# Patient Record
Sex: Male | Born: 1965 | Race: White | Hispanic: Refuse to answer | Marital: Married | Smoking: Former smoker
Health system: Southern US, Community
[De-identification: ages and names within clinical notes are randomized; demographics above are authoritative.]

## PROBLEM LIST (undated history)

## (undated) DIAGNOSIS — B019 Varicella without complication: Secondary | ICD-10-CM

## (undated) DIAGNOSIS — Z803 Family history of malignant neoplasm of breast: Secondary | ICD-10-CM

## (undated) DIAGNOSIS — Z800 Family history of malignant neoplasm of digestive organs: Secondary | ICD-10-CM

## (undated) DIAGNOSIS — Z8041 Family history of malignant neoplasm of ovary: Secondary | ICD-10-CM

## (undated) DIAGNOSIS — Z8481 Family history of carrier of genetic disease: Secondary | ICD-10-CM

## (undated) DIAGNOSIS — J309 Allergic rhinitis, unspecified: Secondary | ICD-10-CM

## (undated) HISTORY — DX: Family history of malignant neoplasm of breast: Z80.3

## (undated) HISTORY — DX: Family history of carrier of genetic disease: Z84.81

## (undated) HISTORY — DX: Allergic rhinitis, unspecified: J30.9

## (undated) HISTORY — DX: Family history of malignant neoplasm of digestive organs: Z80.0

## (undated) HISTORY — DX: Family history of malignant neoplasm of ovary: Z80.41

## (undated) HISTORY — DX: Varicella without complication: B01.9

---

## 2015-09-25 ENCOUNTER — Ambulatory Visit (INDEPENDENT_AMBULATORY_CARE_PROVIDER_SITE_OTHER): Payer: Managed Care, Other (non HMO) | Admitting: Family Medicine

## 2015-09-25 ENCOUNTER — Encounter: Payer: Self-pay | Admitting: Family Medicine

## 2015-09-25 VITALS — BP 124/82 | HR 87 | Temp 98.4°F | Ht 65.0 in | Wt 198.0 lb

## 2015-09-25 DIAGNOSIS — F418 Other specified anxiety disorders: Secondary | ICD-10-CM

## 2015-09-25 DIAGNOSIS — F419 Anxiety disorder, unspecified: Secondary | ICD-10-CM

## 2015-09-25 DIAGNOSIS — R6889 Other general symptoms and signs: Secondary | ICD-10-CM | POA: Diagnosis not present

## 2015-09-25 DIAGNOSIS — Z13220 Encounter for screening for lipoid disorders: Secondary | ICD-10-CM

## 2015-09-25 DIAGNOSIS — F32A Depression, unspecified: Secondary | ICD-10-CM

## 2015-09-25 DIAGNOSIS — Z0000 Encounter for general adult medical examination without abnormal findings: Secondary | ICD-10-CM

## 2015-09-25 DIAGNOSIS — E669 Obesity, unspecified: Secondary | ICD-10-CM

## 2015-09-25 DIAGNOSIS — F329 Major depressive disorder, single episode, unspecified: Secondary | ICD-10-CM | POA: Diagnosis not present

## 2015-09-25 DIAGNOSIS — Z0001 Encounter for general adult medical examination with abnormal findings: Secondary | ICD-10-CM

## 2015-09-25 NOTE — Progress Notes (Signed)
Pre visit review using our clinic review tool, if applicable. No additional management support is needed unless otherwise documented below in the visit note. 

## 2015-09-25 NOTE — Patient Instructions (Signed)
Nice to meet you. Please monitor your stress and anxiety and depression. If this worsens refill as though he needed help please let us know. We'll obtain some lab work. Please give us a call and let us know what your sisters breast cancer mutation was. Please work on diet and exercise as well.  if you develop thoughts of harming yourself or others please seek medical attention immediately.  Diet Recommendations  Starchy (carb) foods: Bread, rice, pasta, potatoes, corn, cereal, grits, crackers, bagels, muffins, all baked goods.  (Fruits, milk, and yogurt also have carbohydrate, but most of these foods will not spike your blood sugar as the starchy foods will.)  A few fruits do cause high blood sugars; use small portions of bananas (limit to 1/2 at a time), grapes, watermelon, oranges, and most tropical fruits.    Protein foods: Meat, fish, poultry, eggs, dairy foods, and beans such as pinto and kidney beans (beans also provide carbohydrate).   1. Eat at least 3 meals and 1-2 snacks per day. Never go more than 4-5 hours while awake without eating. Eat breakfast within the first hour of getting up.   2. Limit starchy foods to TWO per meal and ONE per snack. ONE portion of a starchy  food is equal to the following:   - ONE slice of bread (or its equivalent, such as half of a hamburger bun).   - 1/2 cup of a "scoopable" starchy food such as potatoes or rice.   - 15 grams of carbohydrate as shown on food label.  3. Include at every meal: a protein food, a carb food, and vegetables and/or fruit.   - Obtain twice the volume of veg's as protein or carbohydrate foods for both lunch and dinner.   - Fresh or frozen veg's are best.   - Keep frozen veg's on hand for a quick vegetable serving.

## 2015-09-26 ENCOUNTER — Encounter: Payer: Self-pay | Admitting: Family Medicine

## 2015-09-26 DIAGNOSIS — F419 Anxiety disorder, unspecified: Secondary | ICD-10-CM

## 2015-09-26 DIAGNOSIS — F329 Major depressive disorder, single episode, unspecified: Secondary | ICD-10-CM | POA: Insufficient documentation

## 2015-09-26 DIAGNOSIS — Z0001 Encounter for general adult medical examination with abnormal findings: Secondary | ICD-10-CM | POA: Insufficient documentation
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
## 2015-09-26 NOTE — Assessment & Plan Note (Signed)
Suspect related to home and work stressors. Offered options for treatment including medication and therapy, though he declined treatment at this time. Preferring to continue to monitor. He will let us know if he needs any assistance with this. Given return precautions.

## 2015-09-26 NOTE — Assessment & Plan Note (Signed)
BMI an obese range. Discussed diet and exercise.

## 2015-09-26 NOTE — Assessment & Plan Note (Signed)
Overall doing well. See anxiety and depression for discussion of this. Blood pressure in the normal range. No longer smokes. No current significant alcohol use. Tdap up-to-date. Diet and exercise discussed. We'll obtain labs as outlined.

## 2015-09-30 ENCOUNTER — Other Ambulatory Visit: Payer: Self-pay | Admitting: Family Medicine

## 2015-10-01 LAB — COMPREHENSIVE METABOLIC PANEL
A/G RATIO: 1.8 (ref 1.2–2.2)
ALK PHOS: 78 IU/L (ref 39–117)
ALT: 41 IU/L (ref 0–44)
AST: 19 IU/L (ref 0–40)
Albumin: 4.4 g/dL (ref 3.5–5.5)
BILIRUBIN TOTAL: 0.4 mg/dL (ref 0.0–1.2)
BUN/Creatinine Ratio: 13 (ref 9–20)
BUN: 12 mg/dL (ref 6–24)
CHLORIDE: 103 mmol/L (ref 96–106)
CO2: 25 mmol/L (ref 18–29)
Calcium: 10.1 mg/dL (ref 8.7–10.2)
Creatinine, Ser: 0.95 mg/dL (ref 0.76–1.27)
GFR calc non Af Amer: 94 mL/min/{1.73_m2} (ref >59)
GFR, EST AFRICAN AMERICAN: 109 mL/min/{1.73_m2} (ref >59)
Globulin, Total: 2.4 g/dL (ref 1.5–4.5)
Glucose: 100 mg/dL — ABNORMAL HIGH (ref 65–99)
POTASSIUM: 4.8 mmol/L (ref 3.5–5.2)
Sodium: 143 mmol/L (ref 134–144)
TOTAL PROTEIN: 6.8 g/dL (ref 6.0–8.5)

## 2015-10-01 LAB — CBC
HEMOGLOBIN: 16.4 g/dL (ref 12.6–17.7)
Hematocrit: 47.2 % (ref 37.5–51.0)
MCH: 31.2 pg (ref 26.6–33.0)
MCHC: 34.7 g/dL (ref 31.5–35.7)
MCV: 90 fL (ref 79–97)
Platelets: 256 10*3/uL (ref 150–379)
RBC: 5.26 x10E6/uL (ref 4.14–5.80)
RDW: 14.1 % (ref 12.3–15.4)
WBC: 7.6 10*3/uL (ref 3.4–10.8)

## 2015-10-01 LAB — LIPID PANEL W/O CHOL/HDL RATIO
CHOLESTEROL TOTAL: 212 mg/dL — AB (ref 100–199)
HDL: 30 mg/dL — ABNORMAL LOW (ref >39)
LDL CALC: 145 mg/dL — AB (ref 0–99)
TRIGLYCERIDES: 187 mg/dL — AB (ref 0–149)
VLDL Cholesterol Cal: 37 mg/dL (ref 5–40)

## 2015-10-01 LAB — HGB A1C W/O EAG: Hgb A1c MFr Bld: 5.8 % — ABNORMAL HIGH (ref 4.8–5.6)

## 2015-10-01 LAB — TSH: TSH: 1.94 u[IU]/mL (ref 0.450–4.500)

## 2015-10-10 ENCOUNTER — Ambulatory Visit (INDEPENDENT_AMBULATORY_CARE_PROVIDER_SITE_OTHER): Payer: Managed Care, Other (non HMO) | Admitting: Family Medicine

## 2015-10-10 ENCOUNTER — Encounter: Payer: Self-pay | Admitting: Family Medicine

## 2015-10-10 VITALS — BP 124/88 | HR 79 | Temp 97.6°F | Ht 65.0 in | Wt 196.8 lb

## 2015-10-10 DIAGNOSIS — J209 Acute bronchitis, unspecified: Secondary | ICD-10-CM | POA: Diagnosis not present

## 2015-10-10 DIAGNOSIS — Z8481 Family history of carrier of genetic disease: Secondary | ICD-10-CM | POA: Diagnosis not present

## 2015-10-10 DIAGNOSIS — Z803 Family history of malignant neoplasm of breast: Secondary | ICD-10-CM

## 2015-10-10 MED ORDER — PREDNISONE 20 MG PO TABS: 40.0000 mg | ORAL_TABLET | Freq: Every day | ORAL | Status: DC

## 2015-10-10 MED ORDER — ALBUTEROL SULFATE (2.5 MG/3ML) 0.083% IN NEBU
2.5000 mg | INHALATION_SOLUTION | Freq: Once | RESPIRATORY_TRACT | Status: AC
  Administered 2015-10-10: 2.5 mg via RESPIRATORY_TRACT

## 2015-10-10 MED ORDER — ALBUTEROL SULFATE HFA 108 (90 BASE) MCG/ACT IN AERS: 2.0000 | INHALATION_SPRAY | Freq: Four times a day (QID) | RESPIRATORY_TRACT | Status: DC | PRN

## 2015-10-10 MED ORDER — HYDROCODONE-HOMATROPINE 5-1.5 MG/5ML PO SYRP: 5.0000 mL | ORAL_SOLUTION | Freq: Three times a day (TID) | ORAL | Status: DC | PRN

## 2015-10-10 NOTE — Progress Notes (Signed)
Patient ID: Angel Hudson, male   DOB: 03-Nov-1965, 50 y.o.   MRN: [id number]  Tommi Rumps, MD Phone: [phone]  Angel Hudson is a 50 y.o. male who presents today for same-day visit.  Patient notes several weeks of cough and congestion. Notes most of his congestion is in his chest. Cough is productive of yellowish clear sputum. Some sinus congestion. Some ear congestion. No fevers. Some shortness of breath with cough. Mild shortness of breath with exertion. No chest pain. No diaphoresis. He has been using Delsym and Mucinex with little benefit.  Patient also found out breast cancer screening tests were done for his sister. BRCA1 appears to have been deleterious. He does not have any information on BRCA2.  PMH: Former smoker   ROS see history of present illness  Objective  Physical Exam Filed Vitals:   10/10/15 1306  BP: 124/88  Pulse: 79  Temp: 97.6 F (36.4 C)    BP Readings from Last 3 Encounters:  10/10/15 124/88  09/25/15 124/82   Wt Readings from Last 3 Encounters:  10/10/15 196 lb 12.8 oz (89.268 kg)  09/25/15 198 lb (89.812 kg)    Physical Exam  Constitutional: He is well-developed, well-nourished, and in no distress.  HENT:  Head: Normocephalic and atraumatic.  Right Ear: External ear normal.  Left Ear: External ear normal.  Mouth/Throat: Oropharynx is clear and moist. No oropharyngeal exudate.  Normal TMs bilaterally  Eyes: Conjunctivae are normal. Pupils are equal, round, and reactive to light.  Neck: Neck supple.  Cardiovascular: Normal rate, regular rhythm and normal heart sounds.   Pulmonary/Chest: Effort normal and breath sounds normal. No respiratory distress. He has no wheezes. He has no rales.  Lymphadenopathy:    He has no cervical adenopathy.  Neurological: He is alert. Gait normal.  Skin: Skin is warm and dry. He is not diaphoretic.   Patient was given an albuterol nebulizer. He reports his symptoms are similar and it is mostly chest  congestion following this. Lungs remain clear.  Assessment/Plan: Please see individual problem list.  Acute bronchitis Patient's symptoms consistent with acute bronchitis. Benign lung exam. Stable vital signs. We will treat with prednisone and an albuterol inhaler. Given return precautions.  Family history of BRCA1 gene positive Patient would like to defer testing until June at his follow-up. Plan to test BRCA1 at that time.    No orders of the defined types were placed in this encounter.    Meds ordered this encounter  Medications  . predniSONE (DELTASONE) 20 MG tablet    Sig: Take 2 tablets (40 mg total) by mouth daily with breakfast.    Dispense:  10 tablet    Refill:  0  . albuterol (PROVENTIL HFA;VENTOLIN HFA) 108 (90 Base) MCG/ACT inhaler    Sig: Inhale 2 puffs into the lungs every 6 (six) hours as needed for wheezing or shortness of breath.    Dispense:  1 Inhaler    Refill:  0  . HYDROcodone-homatropine (HYCODAN) 5-1.5 MG/5ML syrup    Sig: Take 5 mLs by mouth every 8 (eight) hours as needed for cough.    Dispense:  120 mL    Refill:  0    Tommi Rumps, MD [place]

## 2015-10-10 NOTE — Progress Notes (Signed)
Pre visit review using our clinic review tool, if applicable. No additional management support is needed unless otherwise documented below in the visit note. 

## 2015-10-10 NOTE — Assessment & Plan Note (Signed)
Patient's symptoms consistent with acute bronchitis. Benign lung exam. Stable vital signs. We will treat with prednisone and an albuterol inhaler. Given return precautions.

## 2015-10-10 NOTE — Addendum Note (Signed)
Addended by: Dorian PodWHEELEY, Homer Pfeifer J on: 10/10/2015 01:40 PM   Modules accepted: Orders

## 2015-10-10 NOTE — Assessment & Plan Note (Signed)
Patient would like to defer testing until June at his follow-up. Plan to test BRCA1 at that time.

## 2015-10-10 NOTE — Patient Instructions (Addendum)
Nice to see you.  Your symptoms are likely related to bronchitis. We will treat this with prednisone and an albuterol inhaler. You can take Hycodan for cough. If you develop chest pain, shortness of breath, cough blood, fevers, or any new or changing symptoms please seek medical attention.  Please check with your sister to see if she had BRCA2 testing completed.

## 2015-12-26 ENCOUNTER — Encounter: Payer: Self-pay | Admitting: Family Medicine

## 2015-12-26 ENCOUNTER — Ambulatory Visit (INDEPENDENT_AMBULATORY_CARE_PROVIDER_SITE_OTHER): Payer: Managed Care, Other (non HMO) | Admitting: Family Medicine

## 2015-12-26 VITALS — BP 116/80 | HR 94 | Temp 98.7°F | Wt 204.8 lb

## 2015-12-26 DIAGNOSIS — F419 Anxiety disorder, unspecified: Secondary | ICD-10-CM

## 2015-12-26 DIAGNOSIS — E785 Hyperlipidemia, unspecified: Secondary | ICD-10-CM | POA: Diagnosis not present

## 2015-12-26 DIAGNOSIS — K219 Gastro-esophageal reflux disease without esophagitis: Secondary | ICD-10-CM | POA: Diagnosis not present

## 2015-12-26 DIAGNOSIS — F418 Other specified anxiety disorders: Secondary | ICD-10-CM | POA: Diagnosis not present

## 2015-12-26 DIAGNOSIS — F329 Major depressive disorder, single episode, unspecified: Secondary | ICD-10-CM

## 2015-12-26 DIAGNOSIS — Z8481 Family history of carrier of genetic disease: Secondary | ICD-10-CM

## 2015-12-26 DIAGNOSIS — F32A Depression, unspecified: Secondary | ICD-10-CM

## 2015-12-26 DIAGNOSIS — E669 Obesity, unspecified: Secondary | ICD-10-CM

## 2015-12-26 NOTE — Assessment & Plan Note (Signed)
We will order BRCA1 testing today. This will be done through [facility]. 

## 2015-12-26 NOTE — Assessment & Plan Note (Signed)
Advised on diet and exercise. 

## 2015-12-26 NOTE — Assessment & Plan Note (Signed)
LDL elevated previously. We will recheck this today.

## 2015-12-26 NOTE — Progress Notes (Signed)
Patient ID: Angel Hudson, male   DOB: 11/25/1965, 50 y.o.   MRN: [id number]  Angel Rumps, MD Phone: [phone]  Angel Hudson is a 50 y.o. male who presents today for follow-up.  Anxiety/ depression: Patient notes he is fine from this standpoint. Minimal anxiety and depression. No SI.  HYPERLIPIDEMIA Symptoms Chest pain on exertion:  No   Leg claudication:   No Patient was to work on diet and exercise and reports he has not made any changes to this.  GERD: Patient notes intermittent feeling a burning sensation in his chest and sour taste in his throat. No chest pressure with it. No radiation. Infrequently gets a tingling underneath his tongue that is not always associated with the heartburn. Notes his symptoms are better with being up and moving around. Does not take anything for reflux at this time. No abdominal pain or blood in the stool.  Low back pain: Patient notes issues with his low back for a long time. Was under [employer] for a while. Occasionally gets pain radiating to his legs. No numbness or weakness. No loss of bowel or bladder function, saddle anesthesia, fevers, or history of cancer.  Skin tags: Patient notes several skin tags along his shirt line. Once to get these removed.  PMH: Former smoker   ROS see history of present illness  Objective  Physical Exam Filed Vitals:   12/26/15 1549  BP: 116/80  Pulse: 94  Temp: 98.7 F (37.1 C)    BP Readings from Last 3 Encounters:  12/26/15 116/80  10/10/15 124/88  09/25/15 124/82   Wt Readings from Last 3 Encounters:  12/26/15 204 lb 12.8 oz (92.897 kg)  10/10/15 196 lb 12.8 oz (89.268 kg)  09/25/15 198 lb (89.812 kg)    Physical Exam  Constitutional: He is well-developed, well-nourished, and in no distress.  HENT:  Head: Normocephalic and atraumatic.  Right Ear: External ear normal.  Left Ear: External ear normal.  Mouth/Throat: Oropharynx is clear and moist. No oropharyngeal exudate.  No  palpable lesions underneath his tongue inside of his mouth or on palpating underneath the mandible, no visual changes in this area either  Eyes: Conjunctivae are normal. Pupils are equal, round, and reactive to light.  Neck:  Several skin tags noted along his shirt line  Cardiovascular: Normal rate, regular rhythm and normal heart sounds.   Pulmonary/Chest: Effort normal and breath sounds normal.  Abdominal: Soft. Bowel sounds are normal. He exhibits no distension. There is no tenderness. There is no rebound and no guarding.  Neurological: He is alert.  CN 2-12 intact, 5/5 strength in bilateral biceps, triceps, grip, quads, hamstrings, plantar and dorsiflexion, sensation to light touch intact in bilateral UE and LE, normal gait, 2+ patellar reflexes  Skin: Skin is warm and dry. He is not diaphoretic.  Psychiatric: Mood and affect normal.     Assessment/Plan: Please see individual problem list.  Family history of BRCA1 gene positive We will order BRCA1 testing today. This will be done through [facility]  Anxiety and depression Stable. He will continue to monitor.  Hyperlipidemia LDL elevated previously. We will recheck this today.  GERD (gastroesophageal reflux disease) Patient's symptoms most consistent with GERD. Improved with physical activity and atypical nature make cardiac cause unlikely. Discussed food triggers. No obvious cause for the tingling underneath his tongue on exam. Discussed referral to ENT for this. He refused this. Prilosec for GERD. Dietary changes as well. Given return precautions.  Obesity Advised on diet and exercise.  Angel Rumps, MD [place]

## 2015-12-26 NOTE — Progress Notes (Signed)
Pre visit review using our clinic review tool, if applicable. No additional management support is needed unless otherwise documented below in the visit note. 

## 2015-12-26 NOTE — Assessment & Plan Note (Signed)
Stable.  He will continue to monitor. 

## 2015-12-26 NOTE — Patient Instructions (Signed)
Nice to see you. Please start over-the-counter Prilosec for your heartburn. Please start work on diet and exercise as well. Please go get the lab tests as ordered. If you develop chest pressure, shortness of breath, numbness, weakness, anxiety, depression, or any new or changing symptoms please seek medical attention.

## 2015-12-26 NOTE — Assessment & Plan Note (Signed)
Patient's symptoms most consistent with GERD. Improved with physical activity and atypical nature make cardiac cause unlikely. Discussed food triggers. No obvious cause for the tingling underneath his tongue on exam. Discussed referral to ENT for this. He refused this. Prilosec for GERD. Dietary changes as well. Given return precautions.

## 2016-01-17 ENCOUNTER — Other Ambulatory Visit: Payer: Self-pay | Admitting: Family Medicine

## 2016-01-20 ENCOUNTER — Telehealth: Payer: Self-pay | Admitting: Family Medicine

## 2016-01-20 NOTE — Telephone Encounter (Signed)
Angel Hudson [phone] called from [facility] regarding needing to clarify test results from 07/21 that was ordered on pt. They have not ran test yet need some addl information. Need a call back within the next 2 days if not they will have to cancel testing. Thank you!

## 2016-01-20 NOTE — Telephone Encounter (Signed)
Left a VM to return my call. 

## 2016-01-21 LAB — BRCA1 TARGETED ANALYSIS

## 2016-01-21 NOTE — Telephone Encounter (Signed)
Spoke with Stanton Kidney at [employer].  She has a specimen in the lab for his Targeted BRCA 1 testing.  They need a mutation report from a family member that has it to do a targeted analysis of the BRCA 1.  If that is not available that specific test can't be completed.  They will need to do a comprehensive BRCA 1 test (Which is more expensive) but without the mutation report is the only possibility.    If we have the requested report it can be faxed directly to [facility] at [phone].  Please advise. thanks

## 2016-01-22 NOTE — Telephone Encounter (Signed)
Please contact the patient to see if he has the targeted analysis from his sister. If he does not they will need to do the comprehensive BRCA1 test.

## 2016-01-22 NOTE — Telephone Encounter (Signed)
Left a VM to return my call. 

## 2016-01-23 ENCOUNTER — Telehealth: Payer: Self-pay | Admitting: Family Medicine

## 2016-01-23 NOTE — Telephone Encounter (Signed)
Left second message to return a call to the office, thanks

## 2016-01-23 NOTE — Telephone Encounter (Signed)
Duplicate message, thanks see other note

## 2016-01-23 NOTE — Telephone Encounter (Signed)
Pt is calling about his test results. Pt would like to speak to [facility]. [phone].

## 2016-01-23 NOTE — Telephone Encounter (Signed)
Spoke with the patient,  He would like to do just the comprehensive one. thanks

## 2016-01-23 NOTE — Telephone Encounter (Signed)
Left a message for [facility] at [employer], thanks

## 2016-01-24 NOTE — Telephone Encounter (Signed)
Spoke with mary, order changed to comprehensive as he doesn't have the analysis from his sister. They faxed a ordered to sign for provider, given to CMA to fax back, thanks

## 2016-01-29 ENCOUNTER — Telehealth: Payer: Self-pay | Admitting: *Deleted

## 2016-01-29 NOTE — Telephone Encounter (Signed)
Patient has request lab results  Pt contact [phone]

## 2016-01-29 NOTE — Telephone Encounter (Signed)
Angel Hudson called from [facility] wanting to know if a [employer] form was received it was faxed on 07/31? Form name is [employer]. Dr Birdie Sons needs to sign. Form was faxed again today. Fax back to [phone]. Attention to [facility]. Thank you!

## 2016-01-29 NOTE — Telephone Encounter (Signed)
Did this come in his mailbox today? thanks

## 2016-01-29 NOTE — Telephone Encounter (Signed)
Spoke with patient, reviewed results and documented in result note.

## 2016-01-30 NOTE — Telephone Encounter (Signed)
Formed faxed to [employer]

## 2016-02-17 ENCOUNTER — Telehealth: Payer: Self-pay | Admitting: Surgical

## 2016-02-17 NOTE — Telephone Encounter (Signed)
Luisa HartPatrick from [facility] called and the patient has not contacted them back [facility]. Today is the tenth day so they have canceled the order.

## 2016-02-24 NOTE — Telephone Encounter (Signed)
Noted. We will plan to reorder at some point in the future when the patient is ready for the test.

## 2016-02-24 NOTE — Telephone Encounter (Signed)
FYI

## 2016-03-09 ENCOUNTER — Encounter: Payer: Self-pay | Admitting: Family Medicine

## 2016-03-09 ENCOUNTER — Ambulatory Visit (INDEPENDENT_AMBULATORY_CARE_PROVIDER_SITE_OTHER): Payer: Managed Care, Other (non HMO) | Admitting: Family Medicine

## 2016-03-09 VITALS — BP 114/76 | HR 86 | Temp 98.4°F | Wt 207.8 lb

## 2016-03-09 DIAGNOSIS — Z23 Encounter for immunization: Secondary | ICD-10-CM | POA: Diagnosis not present

## 2016-03-09 DIAGNOSIS — L918 Other hypertrophic disorders of the skin: Secondary | ICD-10-CM | POA: Diagnosis not present

## 2016-03-09 DIAGNOSIS — L570 Actinic keratosis: Secondary | ICD-10-CM | POA: Insufficient documentation

## 2016-03-09 DIAGNOSIS — Q828 Other specified congenital malformations of skin: Secondary | ICD-10-CM

## 2016-03-09 NOTE — Patient Instructions (Signed)
Nice to see you. We'll call when her biopsy results are back. We will refer you to dermatology for the areas on your head. If you develop redness, warmth, drainage, bleeding, fevers, or any new symptoms in the areas of the biopsy please seek medical attention. He can use Tylenol as needed for pain with this.

## 2016-03-09 NOTE — Progress Notes (Signed)
Pre visit review using our clinic review tool, if applicable. No additional management support is needed unless otherwise documented below in the visit note. 

## 2016-03-09 NOTE — Progress Notes (Signed)
Marikay Alar, MD Phone: [phone]  Angel Hudson is a 50 y.o. male who presents today for removal of skin tags and evaluation of other skin lesions.  Patient with scattered skin tags around his neck and under his arms bilaterally skin tags around his neck can become irritated occasionally. Notes some of the ones under his arms become irritated. He wants these removed.  Patient additionally notes 2 areas of concern on his temples bilaterally that are rough and irritated and have been present for a number of months.  ROS see history of present illness  Objective  Physical Exam Vitals:   03/09/16 1552  BP: 114/76  Pulse: 86  Temp: 98.4 F (36.9 C)    BP Readings from Last 3 Encounters:  03/09/16 114/76  12/26/15 116/80  10/10/15 124/88   Wt Readings from Last 3 Encounters:  03/09/16 207 lb 12.8 oz (94.3 kg)  12/26/15 204 lb 12.8 oz (92.9 kg)  10/10/15 196 lb 12.8 oz (89.3 kg)    Physical Exam  Constitutional: No distress.  HENT:  Head:    Neck:    Pulmonary/Chest: Effort normal.  Neurological: He is alert. Gait normal.  Skin: He is not diaphoretic.     Assessment/Plan: Please see individual problem list.  Accessory skin tags Patient with numerous apparent skin tags under his arms and also around his neck. There are 4 fairly large ones around his neck of which these were removed in the areas outlined in the physical exam. See procedure below. Patient has no allergies. Consent was signed as well. We will send these for pathology to confirm that they are skin tags. Referral to dermatology to consider removal of other skin tags underneath his arms.  Actinic keratosis of left temple Area is concerning for actinic keratosis on left temple. Refer to dermatology for further evaluation and consideration of cryotherapy. Area on right temple appears to be a benign nevus though we will have dermatology review this as well.   Orders Placed This Encounter  Procedures   . Flu Vaccine QUAD 36+ mos IM  . Ambulatory referral to Dermatology    Referral Priority:   Routine    Referral Type:   Consultation    Referral Reason:   Specialty Services Required    Requested Specialty:   Dermatology    Number of Visits Requested:   1    No orders of the defined types were placed in this encounter.   Shave Biopsy Procedure Note  Pre-operative Diagnosis: skin tag  Post-operative Diagnosis: same  Locations: Location #1 posterior lateral right neck, location #2 mid lateral right neck, location #3 anterior lateral right neck, location #4 left mid lateral, areas are outlined in the exam above  Indications: symptom relief and diagnostic  Anesthesia: Lidocaine 1% with epinephrine without added sodium bicarbonate  Procedure Details  History of allergy to iodine: no  Patient informed of the risks (including bleeding and infection) and benefits of the  procedure and Written informed consent obtained.  The lesion and surrounding area were given a sterile prep using betadyne. A dermablade was used to shave the 4 locations of skin approximately 1mm by 1mm. Hemostasis achieved with pressure. Antibiotic ointment and a bandage applied.  4 specimens were sent for pathologic examination. The patient tolerated the procedure well.  EBL: Less than 1 mL  Condition: Stable  Complications: none.  Plan: 1. Instructed to keep the wound dry and covered for 24-48h and clean thereafter. 2. Warning signs of infection were reviewed.  3. Recommended that the patient use OTC acetaminophen as needed for pain.    Marikay AlarEric Shakeia Krus, MD [facility] [place]

## 2016-03-09 NOTE — Assessment & Plan Note (Signed)
Patient with numerous apparent skin tags under his arms and also around his neck. There are 4 fairly large ones around his neck of which these were removed in the areas outlined in the physical exam. See procedure below. Patient has no allergies. Consent was signed as well. We will send these for pathology to confirm that they are skin tags. Referral to dermatology to consider removal of other skin tags underneath his arms.

## 2016-03-09 NOTE — Assessment & Plan Note (Signed)
Area is concerning for actinic keratosis on left temple. Refer to dermatology for further evaluation and consideration of cryotherapy. Area on right temple appears to be a benign nevus though we will have dermatology review this as well.

## 2016-03-11 LAB — PATHOLOGY

## 2016-04-17 ENCOUNTER — Telehealth: Payer: Self-pay | Admitting: Family Medicine

## 2016-04-17 NOTE — Telephone Encounter (Signed)
Pt dropped off a  Wellness screening form to be completed by Dr. Birdie SonsSonnenberg. Paper is up front in color folder.

## 2016-04-17 NOTE — Telephone Encounter (Signed)
Form in red folder

## 2016-04-27 NOTE — Telephone Encounter (Signed)
Completed and given to Jamie. 

## 2016-04-28 NOTE — Telephone Encounter (Signed)
LM on patients phone that wellness forms will be up front for him to pick up because they need his signature.

## 2016-05-07 LAB — LDL CHOLESTEROL, DIRECT: LDL Direct: 146 mg/dL — ABNORMAL HIGH (ref 0–99)

## 2016-05-07 LAB — BRCASSURE COMPREHENSIVE TEST

## 2016-05-13 ENCOUNTER — Telehealth: Payer: Self-pay | Admitting: *Deleted

## 2016-05-13 NOTE — Telephone Encounter (Signed)
Pt stated that he received a call, he wasn't sure the reason  Pt contact [phone]

## 2016-07-09 ENCOUNTER — Ambulatory Visit (INDEPENDENT_AMBULATORY_CARE_PROVIDER_SITE_OTHER): Payer: Managed Care, Other (non HMO) | Admitting: Family Medicine

## 2016-07-09 ENCOUNTER — Encounter: Payer: Self-pay | Admitting: Family Medicine

## 2016-07-09 DIAGNOSIS — J209 Acute bronchitis, unspecified: Secondary | ICD-10-CM | POA: Diagnosis not present

## 2016-07-09 MED ORDER — AZITHROMYCIN 250 MG PO TABS: ORAL_TABLET | ORAL | 0 refills | Status: DC

## 2016-07-09 MED ORDER — PREDNISONE 50 MG PO TABS: ORAL_TABLET | ORAL | 0 refills | Status: DC

## 2016-07-09 MED ORDER — HYDROCOD POLST-CPM POLST ER 10-8 MG/5ML PO SUER: 5.0000 mL | Freq: Two times a day (BID) | ORAL | 0 refills | Status: DC | PRN

## 2016-07-09 NOTE — Assessment & Plan Note (Addendum)
New acute illness. Given prior tobacco abuse and duration of symptoms, treating with azithromycin in addition to prednisone and Tussionex.

## 2016-07-09 NOTE — Progress Notes (Signed)
Subjective:  Patient ID: Angel Hudson, male    DOB: 1966/04/27  Age: 51 y.o. MRN: [id number]  CC: Cough, congestion  HPI:  51 year old male with a prior history of tobacco abuse 20 years presents with the above complaints.  Patient states that he's been sick for 3 weeks. He's had cough, congestion. Symptoms are severe. Cough is mildly productive. He reports associated shortness of breath. No fever. He's been using Mucinex without improvement. No known exacerbating factors. No other associated symptoms. No other complaints or concerns at this time.  Social Hx   Social History   Social History  . Marital status: Married    Spouse name: N/A  . Number of children: N/A  . Years of education: N/A   Social History Main Topics  . Smoking status: Former Games developermoker  . Smokeless tobacco: None  . Alcohol use 0.0 oz/week     Comment: Rarely  . Drug use: No  . Sexual activity: Not Asked   Other Topics Concern  . None   Social History Narrative  . None   Review of Systems  Constitutional: Negative for fever.  HENT: Positive for congestion.   Respiratory: Positive for cough and shortness of breath.    Objective:  BP 129/85   Pulse 96   Temp 98.7 F (37.1 C) (Oral)   Resp 14   Wt 206 lb (93.4 kg)   SpO2 97%   BMI 34.28 kg/m   BP/Weight 07/09/2016 03/09/2016 12/26/2015  Systolic BP 129 114 116  Diastolic BP 85 76 80  Wt. (Lbs) 206 207.8 204.8  BMI 34.28 34.58 34.08   Physical Exam  Constitutional: He is oriented to person, place, and time. He appears well-developed. No distress.  HENT:  Mouth/Throat: Oropharynx is clear and moist.  Normal TMs bilaterally.  Cardiovascular: Normal rate and regular rhythm.   Pulmonary/Chest:  Diffuse wheezing.  Neurological: He is alert and oriented to person, place, and time.  Psychiatric: He has a normal mood and affect.  Vitals reviewed.  Lab Results  Component Value Date   WBC 7.6 09/30/2015   HCT 47.2 09/30/2015   PLT 256  09/30/2015   GLUCOSE 100 (H) 09/30/2015   CHOL 212 (H) 09/30/2015   TRIG 187 (H) 09/30/2015   HDL 30 (L) 09/30/2015   LDLDIRECT 146 (H) 01/17/2016   LDLCALC 145 (H) 09/30/2015   ALT 41 09/30/2015   AST 19 09/30/2015   NA 143 09/30/2015   K 4.8 09/30/2015   CL 103 09/30/2015   CREATININE 0.95 09/30/2015   BUN 12 09/30/2015   CO2 25 09/30/2015   TSH 1.940 09/30/2015   HGBA1C 5.8 (H) 09/30/2015   Assessment & Plan:   Problem List Items Addressed This Visit    Acute bronchitis    New acute illness. Given prior tobacco abuse and duration of symptoms, treating with azithromycin in addition to prednisone and Tussionex.        Meds ordered this encounter  Medications  . predniSONE (DELTASONE) 50 MG tablet    Sig: 1 tablet daily x 5 days.    Dispense:  5 tablet    Refill:  0  . chlorpheniramine-HYDROcodone (TUSSIONEX PENNKINETIC ER) 10-8 MG/5ML SUER    Sig: Take 5 mLs by mouth every 12 (twelve) hours as needed.    Dispense:  115 mL    Refill:  0  . azithromycin (ZITHROMAX) 250 MG tablet    Sig: 2 tablets on Day 1, then 1 tablet daily on Days 2-5.  Dispense:  6 tablet    Refill:  0    Follow-up: PRN  Everlene Other DO [facility]

## 2016-07-09 NOTE — Progress Notes (Signed)
Pre visit review using our clinic review tool, if applicable. No additional management support is needed unless otherwise documented below in the visit note. 

## 2016-07-09 NOTE — Patient Instructions (Signed)
Meds as prescribed.  Follow up as needed.  Take care  Dr. Adriana Simasook    Acute Bronchitis, Adult Acute bronchitis is sudden (acute) swelling of the air tubes (bronchi) in the lungs. Acute bronchitis causes these tubes to fill with mucus, which can make it hard to breathe. It can also cause coughing or wheezing. In adults, acute bronchitis usually goes away within 2 weeks. A cough caused by bronchitis may last up to 3 weeks. Smoking, allergies, and asthma can make the condition worse. Repeated episodes of bronchitis may cause further lung problems, such as chronic obstructive pulmonary disease (COPD). What are the causes? This condition can be caused by germs and by substances that irritate the lungs, including:  Cold and flu viruses. This condition is most often caused by the same virus that causes a cold.  Bacteria.  Exposure to tobacco smoke, dust, fumes, and air pollution. What increases the risk? This condition is more likely to develop in people who:  Have close contact with someone with acute bronchitis.  Are exposed to lung irritants, such as tobacco smoke, dust, fumes, and vapors.  Have a weak immune system.  Have a respiratory condition such as asthma. What are the signs or symptoms? Symptoms of this condition include:  A cough.  Coughing up clear, yellow, or green mucus.  Wheezing.  Chest congestion.  Shortness of breath.  A fever.  Body aches.  Chills.  A sore throat. How is this diagnosed? This condition is usually diagnosed with a physical exam. During the exam, your health care provider may order tests, such as chest X-rays, to rule out other conditions. He or she may also:  Test a sample of your mucus for bacterial infection.  Check the level of oxygen in your blood. This is done to check for pneumonia.  Do a chest X-ray or lung function testing to rule out pneumonia and other conditions.  Perform blood tests. Your health care provider will also ask  about your symptoms and medical history. How is this treated? Most cases of acute bronchitis clear up over time without treatment. Your health care provider may recommend:  Drinking more fluids. Drinking more makes your mucus thinner, which may make it easier to breathe.  Taking a medicine for a fever or cough.  Taking an antibiotic medicine.  Using an inhaler to help improve shortness of breath and to control a cough.  Using a cool mist vaporizer or humidifier to make it easier to breathe. Follow these instructions at home: Medicines  Take over-the-counter and prescription medicines only as told by your health care provider.  If you were prescribed an antibiotic, take it as told by your health care provider. Do not stop taking the antibiotic even if you start to feel better. General instructions  Get plenty of rest.  Drink enough fluids to keep your urine clear or pale yellow.  Avoid smoking and secondhand smoke. Exposure to cigarette smoke or irritating chemicals will make bronchitis worse. If you smoke and you need help quitting, ask your health care provider. Quitting smoking will help your lungs heal faster.  Use an inhaler, cool mist vaporizer, or humidifier as told by your health care provider.  Keep all follow-up visits as told by your health care provider. This is important. How is this prevented? To lower your risk of getting this condition again:  Wash your hands often with soap and water. If soap and water are not available, use hand sanitizer.  Avoid contact with people who  have cold symptoms.  Try not to touch your hands to your mouth, nose, or eyes.  Make sure to get the flu shot every year. Contact a health care provider if:  Your symptoms do not improve in 2 weeks of treatment. Get help right away if:  You cough up blood.  You have chest pain.  You have severe shortness of breath.  You become dehydrated.  You faint or keep feeling like you are  going to faint.  You keep vomiting.  You have a severe headache.  Your fever or chills gets worse. This information is not intended to replace advice given to you by your health care provider. Make sure you discuss any questions you have with your health care provider. Document Released: 07/23/2004 Document Revised: 01/08/2016 Document Reviewed: 12/04/2015 Elsevier Interactive Patient Education  2017 [employer].

## 2017-07-13 ENCOUNTER — Other Ambulatory Visit: Payer: Self-pay

## 2017-07-13 ENCOUNTER — Ambulatory Visit: Payer: Managed Care, Other (non HMO) | Admitting: Family Medicine

## 2017-07-13 ENCOUNTER — Encounter: Payer: Self-pay | Admitting: Family Medicine

## 2017-07-13 VITALS — BP 114/90 | HR 81 | Temp 98.1°F | Ht 67.0 in | Wt 205.0 lb

## 2017-07-13 DIAGNOSIS — E6609 Other obesity due to excess calories: Secondary | ICD-10-CM

## 2017-07-13 DIAGNOSIS — R030 Elevated blood-pressure reading, without diagnosis of hypertension: Secondary | ICD-10-CM | POA: Insufficient documentation

## 2017-07-13 DIAGNOSIS — Z6832 Body mass index (BMI) 32.0-32.9, adult: Secondary | ICD-10-CM | POA: Diagnosis not present

## 2017-07-13 DIAGNOSIS — G4733 Obstructive sleep apnea (adult) (pediatric): Secondary | ICD-10-CM | POA: Insufficient documentation

## 2017-07-13 DIAGNOSIS — E669 Obesity, unspecified: Secondary | ICD-10-CM | POA: Diagnosis not present

## 2017-07-13 DIAGNOSIS — Z23 Encounter for immunization: Secondary | ICD-10-CM | POA: Diagnosis not present

## 2017-07-13 DIAGNOSIS — R0681 Apnea, not elsewhere classified: Secondary | ICD-10-CM | POA: Diagnosis not present

## 2017-07-13 DIAGNOSIS — Z1211 Encounter for screening for malignant neoplasm of colon: Secondary | ICD-10-CM

## 2017-07-13 DIAGNOSIS — Z125 Encounter for screening for malignant neoplasm of prostate: Secondary | ICD-10-CM

## 2017-07-13 DIAGNOSIS — Z0001 Encounter for general adult medical examination with abnormal findings: Secondary | ICD-10-CM

## 2017-07-13 NOTE — Progress Notes (Signed)
Tommi Rumps, MD Phone: [phone]  Angel Hudson is a 52 y.o. male who presents today for a physical exam.  Not exercising. Does not watch what he eats.  Probably eats too many fried fatty foods and red meat. Due for colonoscopy. Due for prostate cancer screening. Declines HIV testing. Reports tetanus vaccinations up-to-date 5 years ago. Flu shot given today. No tobacco use.  Rare alcohol use.  No illicit drug use.  Patient reports his wife is concerned about apneic episodes.  He reports he does snore.  He has hypersomnia.  He does not wake up well rested.  BP is elevated.  No history of hypertension.  He does not check it at home.  Active Ambulatory Problems    Diagnosis Date Noted  . Obesity 09/25/2015  . Anxiety and depression 09/26/2015  . Encounter for general adult medical examination with abnormal findings 09/26/2015  . Family history of BRCA1 gene positive 10/10/2015  . Hyperlipidemia 12/26/2015  . GERD (gastroesophageal reflux disease) 12/26/2015  . Accessory skin tags 03/09/2016  . Actinic keratosis of left temple 03/09/2016  . Witnessed apneic spells 07/13/2017  . Elevated blood pressure reading 07/13/2017   Resolved Ambulatory Problems    Diagnosis Date Noted  . Acute bronchitis 10/10/2015   Past Medical History:  Diagnosis Date  . Allergic rhinitis   . Chickenpox     Family History  Problem Relation Age of Onset  . Alcoholism Unknown   . Heart disease Unknown   . Stroke Unknown   . Diabetes Unknown     Social History   Socioeconomic History  . Marital status: Married    Spouse name: Not on file  . Number of children: Not on file  . Years of education: Not on file  . Highest education level: Not on file  Social Needs  . Financial resource strain: Not on file  . Food insecurity - worry: Not on file  . Food insecurity - inability: Not on file  . Transportation needs - medical: Not on file  . Transportation needs - non-medical: Not on  file  Occupational History  . Not on file  Tobacco Use  . Smoking status: Former Research scientist (life sciences)  . Smokeless tobacco: Never Used  Substance and Sexual Activity  . Alcohol use: Yes    Alcohol/week: 0.0 oz    Comment: Rarely  . Drug use: No  . Sexual activity: Not on file  Other Topics Concern  . Not on file  Social History Narrative  . Not on file    ROS  General:  Negative for nexplained weight loss, fever Skin: Negative for new or changing mole, sore that won't heal HEENT: Negative for trouble hearing, trouble seeing, ringing in ears, mouth sores, hoarseness, change in voice, dysphagia. CV:  Negative for chest pain, dyspnea, edema, palpitations Resp: Negative for cough, dyspnea, hemoptysis GI: Negative for nausea, vomiting, diarrhea, constipation, abdominal pain, melena, hematochezia. GU: Negative for dysuria, incontinence, urinary hesitance, hematuria, vaginal or penile discharge, polyuria, sexual difficulty, lumps in testicle or breasts MSK: Negative for muscle cramps or aches, joint pain or swelling Neuro: Negative for headaches, weakness, numbness, dizziness, passing out/fainting Psych: Negative for depression, anxiety, memory problems  Objective  Physical Exam Vitals:   07/13/17 1549  BP: 114/90  Pulse: 81  Temp: 98.1 F (36.7 C)  SpO2: 97%    BP Readings from Last 3 Encounters:  07/13/17 114/90  07/09/16 129/85  03/09/16 114/76   Wt Readings from Last 3 Encounters:  07/13/17 205 lb (  93 kg)  07/09/16 206 lb (93.4 kg)  03/09/16 207 lb 12.8 oz (94.3 kg)    Physical Exam  Constitutional: No distress.  HENT:  Head: Normocephalic and atraumatic.  Mouth/Throat: Oropharynx is clear and moist. No oropharyngeal exudate.  Eyes: Conjunctivae are normal. Pupils are equal, round, and reactive to light.  Neck: Neck supple.  Cardiovascular: Normal rate, regular rhythm and normal heart sounds.  Pulmonary/Chest: Effort normal and breath sounds normal.  Abdominal: Soft.  Bowel sounds are normal. He exhibits no distension. There is no tenderness. There is no rebound and no guarding.  Genitourinary: Rectum normal.  Genitourinary Comments: Minimal prostate enlargement, no nodules  Musculoskeletal: He exhibits no edema.  Lymphadenopathy:    He has no cervical adenopathy.  Neurological: He is alert. Gait normal.  Skin: Skin is warm and dry. He is not diaphoretic.  Psychiatric: Mood and affect normal.     Assessment/Plan:   Encounter for general adult medical examination with abnormal findings Physical exam completed.  I discussed diet and exercise extensively with the patient.  Referral for colonoscopy placed.  PSA will be checked.  Lab work will be ordered through [facility] with CMP, A1c, and lipid panel ordered for fasting labs.  Witnessed apneic spells Patient reports his wife has witnessed apneic episodes.  We will get him set up for a home sleep study.  Obesity Discussed the need to work on diet and exercise for overall health and prevent further health complications.  Elevated blood pressure reading History of hypertension.  Patient does not have a way to check this at home.  We will have him return in 1 week for recheck.  If still elevated consider adding medication.  Encourage diet and exercise.   Orders Placed This Encounter  Procedures  . Flu Vaccine QUAD 36+ mos IM  . Ambulatory referral to Gastroenterology    Referral Priority:   Routine    Referral Type:   Consultation    Referral Reason:   Specialty Services Required    Number of Visits Requested:   1  . Home sleep test    Order Specific Question:   Where should this test be performed:    Answer:       No orders of the defined types were placed in this encounter.     , MD [facility] [place]  

## 2017-07-13 NOTE — Patient Instructions (Addendum)
Nice to see you. Please go get the lab work fasting sometime in the next week. We will get you set up for a sleep study. We will get you referred for colonoscopy as well.  Diet Recommendations  Starchy (carb) foods: Bread, rice, pasta, potatoes, corn, cereal, grits, crackers, bagels, muffins, all baked goods.  (Fruits, milk, and yogurt also have carbohydrate, but most of these foods will not spike your blood sugar as the starchy foods will.)  A few fruits do cause high blood sugars; use small portions of bananas (limit to 1/2 at a time), grapes, watermelon, oranges, and most tropical fruits.    Protein foods: Meat, fish, poultry, eggs, dairy foods, and beans such as pinto and kidney beans (beans also provide carbohydrate).   1. Eat at least 3 meals and 1-2 snacks per day. Never go more than 4-5 hours while awake without eating. Eat breakfast within the first hour of getting up.   2. Limit starchy foods to TWO per meal and ONE per snack. ONE portion of a starchy  food is equal to the following:   - ONE slice of bread (or its equivalent, such as half of a hamburger bun).   - 1/2 cup of a "scoopable" starchy food such as potatoes or rice.   - 15 grams of carbohydrate as shown on food label.  3. Include at every meal: a protein food, a carb food, and vegetables and/or fruit.   - Obtain twice the volume of veg's as protein or carbohydrate foods for both lunch and dinner.   - Fresh or frozen veg's are best.   - Keep frozen veg's on hand for a quick vegetable serving.

## 2017-07-13 NOTE — Assessment & Plan Note (Signed)
Patient reports his wife has witnessed apneic episodes.  We will get him set up for a home sleep study.

## 2017-07-13 NOTE — Assessment & Plan Note (Signed)
History of hypertension.  Patient does not have a way to check this at home.  We will have him return in 1 week for recheck.  If still elevated consider adding medication.  Encourage diet and exercise.

## 2017-07-13 NOTE — Assessment & Plan Note (Signed)
Physical exam completed.  I discussed diet and exercise extensively with the patient.  Referral for colonoscopy placed.  PSA will be checked.  Lab work will be ordered through [facility] with CMP, A1c, and lipid panel ordered for fasting labs.

## 2017-07-13 NOTE — Assessment & Plan Note (Signed)
Discussed the need to work on diet and exercise for overall health and prevent further health complications.

## 2017-07-20 ENCOUNTER — Other Ambulatory Visit: Payer: Self-pay | Admitting: Family Medicine

## 2017-07-21 LAB — COMPREHENSIVE METABOLIC PANEL
A/G RATIO: 1.7 (ref 1.2–2.2)
ALT: 31 IU/L (ref 0–44)
AST: 17 IU/L (ref 0–40)
Albumin: 4.5 g/dL (ref 3.5–5.5)
Alkaline Phosphatase: 77 IU/L (ref 39–117)
BUN/Creatinine Ratio: 12 (ref 9–20)
BUN: 11 mg/dL (ref 6–24)
Bilirubin Total: 0.4 mg/dL (ref 0.0–1.2)
CALCIUM: 9.7 mg/dL (ref 8.7–10.2)
CO2: 26 mmol/L (ref 20–29)
Chloride: 102 mmol/L (ref 96–106)
Creatinine, Ser: 0.92 mg/dL (ref 0.76–1.27)
GFR calc Af Amer: 112 mL/min/{1.73_m2} (ref >59)
GFR calc non Af Amer: 97 mL/min/{1.73_m2} (ref >59)
GLOBULIN, TOTAL: 2.7 g/dL (ref 1.5–4.5)
Glucose: 114 mg/dL — ABNORMAL HIGH (ref 65–99)
POTASSIUM: 4.1 mmol/L (ref 3.5–5.2)
SODIUM: 141 mmol/L (ref 134–144)
Total Protein: 7.2 g/dL (ref 6.0–8.5)

## 2017-07-21 LAB — LIPID PANEL W/O CHOL/HDL RATIO
Cholesterol, Total: 187 mg/dL (ref 100–199)
HDL: 32 mg/dL — ABNORMAL LOW (ref >39)
LDL Calculated: 122 mg/dL — ABNORMAL HIGH (ref 0–99)
TRIGLYCERIDES: 165 mg/dL — AB (ref 0–149)
VLDL Cholesterol Cal: 33 mg/dL (ref 5–40)

## 2017-07-21 LAB — HGB A1C W/O EAG: Hgb A1c MFr Bld: 5.7 % — ABNORMAL HIGH (ref 4.8–5.6)

## 2017-07-21 LAB — PSA: PROSTATE SPECIFIC AG, SERUM: 0.7 ng/mL (ref 0.0–4.0)

## 2017-07-21 NOTE — Progress Notes (Signed)
The 10-year ASCVD risk score Denman George(Goff DC Montez HagemanJr., et al., 2013) is: 4.1%   Values used to calculate the score:     Age: [age] years     Sex: Male     Is Non-Hispanic African American: No     Diabetic: No     Tobacco smoker: No     Systolic Blood Pressure: 114 mmHg     Is BP treated: No     HDL Cholesterol: 32 mg/dL     Total Cholesterol: 187 mg/dL

## 2017-07-28 ENCOUNTER — Telehealth: Payer: Self-pay

## 2017-07-28 NOTE — Telephone Encounter (Signed)
Sitting and reading: 2  Watching tv: 2  sitting inactive in public: 2  As a passenger in a car for an hour without a break:   Lying down to rest in the afternoon when circumstances permit: 2  Sitting and talking to someone : 0  Sitting quietly : 1  In a car in traffic: 0

## 2017-07-28 NOTE — Telephone Encounter (Signed)
Noted.  Please complete this on the insurance form and then I will sign and we can submit it.  Thanks.

## 2017-07-28 NOTE — Telephone Encounter (Signed)
Called patient to complete epworth sleepiness scale  0-no chance of dozing  1- slight chance of dozing 2-moderate chance of dozing 3- high chance of dozing    Sitting and reading?  watching tv?  Sitting inactive in public place (theater, a meeting)?  As a passenger in a car for an hour without a break?  Lying down to rest in the afternoon when circumstances permit?  Sitting and talking to someone?  Sitting quietly after lunch without alcohol?  In a car while stopped for a few minutes in traffic?  Ok for pec to speak to patient and ask questions above

## 2017-07-29 NOTE — Telephone Encounter (Signed)
faxed

## 2017-07-29 NOTE — Telephone Encounter (Signed)
It does not appear to require a signature. Please fill in our info and fax.

## 2017-07-29 NOTE — Telephone Encounter (Signed)
Placed in red folder  

## 2017-08-18 ENCOUNTER — Other Ambulatory Visit: Payer: Self-pay

## 2017-08-18 ENCOUNTER — Telehealth: Payer: Self-pay

## 2017-08-18 DIAGNOSIS — Z1211 Encounter for screening for malignant neoplasm of colon: Secondary | ICD-10-CM

## 2017-08-18 NOTE — Telephone Encounter (Signed)
Gastroenterology Pre-Procedure Review  Request Date: 09/13/17 Requesting Physician: Dr. Tobi BastosAnna  PATIENT REVIEW QUESTIONS: The patient responded to the following health history questions as indicated:    1. Are you having any GI issues? no 2. Do you have a personal history of Polyps? no 3. Do you have a family history of Colon Cancer or Polyps? no 4. Diabetes Mellitus? no 5. Joint replacements in the past 12 months?no 6. Major health problems in the past 3 months?no 7. Any artificial heart valves, MVP, or defibrillator?no    MEDICATIONS & ALLERGIES:    Patient reports the following regarding taking any anticoagulation/antiplatelet therapy:   Plavix, Coumadin, Eliquis, Xarelto, Lovenox, Pradaxa, Brilinta, or Effient? no Aspirin? no  Patient confirms/reports the following medications:  No current outpatient medications on file.   No current facility-administered medications for this visit.     Patient confirms/reports the following allergies:  No Known Allergies  No orders of the defined types were placed in this encounter.   AUTHORIZATION INFORMATION Primary Insurance: 1D#: Group #:  Secondary Insurance: 1D#: Group #:  SCHEDULE INFORMATION: Date: 09/13/17 Time: Location:[facility]

## 2017-09-07 ENCOUNTER — Telehealth: Payer: Self-pay | Admitting: Family Medicine

## 2017-09-07 DIAGNOSIS — G4733 Obstructive sleep apnea (adult) (pediatric): Secondary | ICD-10-CM

## 2017-09-07 NOTE — Telephone Encounter (Signed)
Please letthe patient know that his sleep study revealed sleep apnea.  They recommended a CPAP titration study which I would like to refer him for if he is willing.  Please check to see if he has excessive daytime sleepiness, trouble sleeping, or if he has continued to have blood pressure issues.  We will need to have him recheck his blood pressure in the office as well.  Thanks.

## 2017-09-08 NOTE — Addendum Note (Signed)
Addended by: Glori LuisSONNENBERG, Attie Nawabi G on: 09/08/2017 05:22 PM   Modules accepted: Orders

## 2017-09-08 NOTE — Telephone Encounter (Signed)
fyi

## 2017-09-08 NOTE — Telephone Encounter (Signed)
Patient returned call, patient given results per note Dr. Birdie SonsSonnenberg on 09/07/17. He says he will have to check with his insurance company to see if they will pay for the CPAP Titration Study. He also says he doesn't have daytime sleepiness or trouble sleeping. He says his BP's have been elevated and he is willing to come in for a BP check. I advised him this would be sent to Dr. Birdie SonsSonnenberg and someone will be calling him with an appointment, he verbalized understanding.

## 2017-09-08 NOTE — Telephone Encounter (Signed)
Left message to return call ok for pec to speak with patient about message below  Please letthe patient know that his sleep study revealed sleep apnea.  They recommended a CPAP titration study which I would like to refer him for if he is willing.  Please check to see if he has excessive daytime sleepiness, trouble sleeping, or if he has continued to have blood pressure issues.  We will need to have him recheck his blood pressure in the office as well.  Thanks.

## 2017-09-08 NOTE — Telephone Encounter (Signed)
With the patient's elevated blood pressure and his obstructive sleep apnea I believe this should be done.  He can check with his insurance though I went ahead and placed an order for CPAP titration so that they can process it through his insurance to see if they will pay for it.  If so he should have this done.  Thanks.

## 2017-09-09 NOTE — Telephone Encounter (Signed)
Left message to return call, ok for pec to speak to patient and notify him of message below

## 2017-09-10 NOTE — Telephone Encounter (Signed)
Left message to return call, ok for pec to speak to patient and notify him of message below 

## 2017-09-13 ENCOUNTER — Ambulatory Visit: Payer: Managed Care, Other (non HMO) | Admitting: Anesthesiology

## 2017-09-13 ENCOUNTER — Encounter
Admission: RE | Disposition: A | Discharge status: Home / Self Care | Payer: Self-pay | Source: Ambulatory Visit | Attending: Gastroenterology

## 2017-09-13 ENCOUNTER — Encounter: Payer: Self-pay | Admitting: *Deleted

## 2017-09-13 ENCOUNTER — Ambulatory Visit
Admission: RE | Admit: 2017-09-13 | Discharge: 2017-09-13 | Disposition: A | Discharge status: Home / Self Care | Payer: Managed Care, Other (non HMO) | Source: Ambulatory Visit | Attending: Gastroenterology | Admitting: Gastroenterology

## 2017-09-13 DIAGNOSIS — Z87891 Personal history of nicotine dependence: Secondary | ICD-10-CM | POA: Diagnosis not present

## 2017-09-13 DIAGNOSIS — Z1211 Encounter for screening for malignant neoplasm of colon: Secondary | ICD-10-CM | POA: Insufficient documentation

## 2017-09-13 DIAGNOSIS — K5730 Diverticulosis of large intestine without perforation or abscess without bleeding: Secondary | ICD-10-CM | POA: Diagnosis not present

## 2017-09-13 HISTORY — PX: COLONOSCOPY WITH PROPOFOL: SHX5780

## 2017-09-13 SURGERY — COLONOSCOPY WITH PROPOFOL
Anesthesia: General

## 2017-09-13 MED ORDER — FENTANYL CITRATE (PF) 100 MCG/2ML IJ SOLN
INTRAMUSCULAR | Status: AC
  Filled 2017-09-13: qty 2

## 2017-09-13 MED ORDER — MIDAZOLAM HCL 2 MG/2ML IJ SOLN
INTRAMUSCULAR | Status: DC | PRN
  Administered 2017-09-13: 2 mg via INTRAVENOUS

## 2017-09-13 MED ORDER — LIDOCAINE HCL (CARDIAC) 20 MG/ML IV SOLN
INTRAVENOUS | Status: DC | PRN
  Administered 2017-09-13: 40 mg via INTRAVENOUS

## 2017-09-13 MED ORDER — LIDOCAINE HCL (PF) 1 % IJ SOLN: 2.0000 mL | Freq: Once | INTRAMUSCULAR | Status: DC

## 2017-09-13 MED ORDER — FENTANYL CITRATE (PF) 100 MCG/2ML IJ SOLN
INTRAMUSCULAR | Status: DC | PRN
  Administered 2017-09-13: 50 ug via INTRAVENOUS

## 2017-09-13 MED ORDER — SODIUM CHLORIDE 0.9 % IV SOLN
INTRAVENOUS | Status: DC
  Administered 2017-09-13: 10:00:00 via INTRAVENOUS

## 2017-09-13 MED ORDER — LIDOCAINE HCL (PF) 1 % IJ SOLN
INTRAMUSCULAR | Status: AC
  Filled 2017-09-13: qty 2

## 2017-09-13 MED ORDER — PROPOFOL 500 MG/50ML IV EMUL
INTRAVENOUS | Status: AC
  Filled 2017-09-13: qty 50

## 2017-09-13 MED ORDER — PROPOFOL 10 MG/ML IV BOLUS
INTRAVENOUS | Status: DC | PRN
  Administered 2017-09-13: 140 mg via INTRAVENOUS
  Administered 2017-09-13: 200 mg via INTRAVENOUS

## 2017-09-13 MED ORDER — MIDAZOLAM HCL 2 MG/2ML IJ SOLN
INTRAMUSCULAR | Status: AC
Start: 2017-09-13 — End: ?
  Filled 2017-09-13: qty 2

## 2017-09-13 MED ORDER — PROPOFOL 500 MG/50ML IV EMUL
INTRAVENOUS | Status: DC | PRN
  Administered 2017-09-13: 180 ug/kg/min via INTRAVENOUS

## 2017-09-13 NOTE — Anesthesia Post-op Follow-up Note (Signed)
Anesthesia QCDR form completed.        

## 2017-09-13 NOTE — Anesthesia Preprocedure Evaluation (Signed)
Anesthesia Evaluation  Patient identified by MRN, date of birth, ID band Patient awake    Reviewed: Allergy & Precautions, NPO status , Patient's Chart, lab work & pertinent test results  History of Anesthesia Complications Negative for: history of anesthetic complications  Airway Mallampati: II  TM Distance: >3 FB Neck ROM: Full    Dental no notable dental hx.    Pulmonary neg sleep apnea, neg COPD, former smoker,    breath sounds clear to auscultation- rhonchi (-) wheezing      Cardiovascular Exercise Tolerance: Good (-) hypertension(-) CAD, (-) Past MI, (-) Cardiac Stents and (-) CABG  Rhythm:Regular Rate:Normal - Systolic murmurs and - Diastolic murmurs    Neuro/Psych Anxiety negative neurological ROS     GI/Hepatic Neg liver ROS, GERD  ,  Endo/Other  negative endocrine ROSneg diabetes  Renal/GU negative Renal ROS     Musculoskeletal negative musculoskeletal ROS (+)   Abdominal (+) + obese,   Peds  Hematology negative hematology ROS (+)   Anesthesia Other Findings Past Medical History: No date: Allergic rhinitis No date: Chickenpox   Reproductive/Obstetrics                             Anesthesia Physical Anesthesia Plan  ASA: II  Anesthesia Plan: General   Post-op Pain Management:    Induction: Intravenous  PONV Risk Score and Plan: 1 and Propofol infusion  Airway Management Planned: Natural Airway  Additional Equipment:   Intra-op Plan:   Post-operative Plan:   Informed Consent: I have reviewed the patients History and Physical, chart, labs and discussed the procedure including the risks, benefits and alternatives for the proposed anesthesia with the patient or authorized representative who has indicated his/her understanding and acceptance.   Dental advisory given  Plan Discussed with: CRNA and Anesthesiologist  Anesthesia Plan Comments:          Anesthesia Quick Evaluation

## 2017-09-13 NOTE — Op Note (Signed)
[facility] Gastroenterology Patient Name: Angel Hudson Procedure Date: 09/13/2017 10:05 AM MRN: [id number] Account #: [id number] Date of Birth: March 01, 1966 Admit Type: Outpatient Age: 52 [facility] ROOM 2 Gender: Male Note Status: Finalized Procedure:            Colonoscopy Indications:          Screening for colorectal malignant neoplasm Providers:            Wyline MoodKiran Mahek Schlesinger MD, MD Referring MD:         Yehuda MaoEric G. Birdie SonsSonnenberg (Referring MD) Medicines:            Monitored Anesthesia Care Complications:        No immediate complications. Procedure:            Pre-Anesthesia Assessment:                       - Prior to the procedure, a History and Physical was                        performed, and patient medications, allergies and                        sensitivities were reviewed. The patient's tolerance of                        previous anesthesia was reviewed.                       - The risks and benefits of the procedure and the                        sedation options and risks were discussed with the                        patient. All questions were answered and informed                        consent was obtained.                       - After reviewing the risks and benefits, the patient                        was deemed in satisfactory condition to undergo the                        procedure.                       - ASA Grade Assessment: II - A patient with mild                        systemic disease.                       After obtaining informed consent, the colonoscope was                        passed under direct vision. Throughout the procedure,                        the  patient's blood pressure, pulse, and oxygen                        saturations were monitored continuously. The                        Colonoscope was introduced through the anus and                        advanced to the the cecum, identified by the                         appendiceal orifice, IC valve and transillumination.                        The colonoscopy was performed with ease. The patient                        tolerated the procedure well. The quality of the bowel                        preparation was good. Findings:      The perianal and digital rectal examinations were normal.      Multiple small-mouthed diverticula were found in the sigmoid colon.      The entire examined colon appeared normal on direct and retroflexion       views.      The exam was otherwise without abnormality on direct and retroflexion       views. Impression:           - Diverticulosis in the sigmoid colon.                       - The entire examined colon is normal on direct and                        retroflexion views.                       - The examination was otherwise normal on direct and                        retroflexion views.                       - No specimens collected. Recommendation:       - Discharge patient to home (with escort).                       - Resume previous diet.                       - Continue present medications.                       - Repeat colonoscopy in 10 years for screening purposes. Procedure Code(s):    --- Professional ---                       [id number], Colonoscopy, flexible; diagnostic, including                        collection of specimen(s)  by brushing or washing, when                        performed (separate procedure) Diagnosis Code(s):    --- Professional ---                       Z12.11, Encounter for screening for malignant neoplasm                        of colon                       K57.30, Diverticulosis of large intestine without                        perforation or abscess without bleeding CPT copyright 2016 American Medical Association. All rights reserved. The codes documented in this report are preliminary and upon coder review may  be revised to meet current compliance requirements. Wyline Mood,  MD Wyline Mood MD, MD 09/13/2017 10:27:51 AM This report has been signed electronically. Number of Addenda: 0 Note Initiated On: 09/13/2017 10:05 AM Scope Withdrawal Time: 0 hours 8 minutes 53 seconds  Total Procedure Duration: 0 hours 10 minutes 46 seconds       [facility]

## 2017-09-13 NOTE — Transfer of Care (Signed)
Immediate Anesthesia Transfer of Care Note  Patient: Angel Hudson  Procedure(s) Performed: COLONOSCOPY WITH PROPOFOL (N/A )  Patient Location: PACU  Anesthesia Type:General  Level of Consciousness: sedated  Airway & Oxygen Therapy: Patient Spontanous Breathing and Patient connected to nasal cannula oxygen  Post-op Assessment: Report given to RN and Post -op Vital signs reviewed and stable  Post vital signs: Reviewed and stable  Last Vitals:  Vitals:   09/13/17 0923  BP: (!) 141/99  Pulse: 73  Resp: 16  Temp: (!) 35.9 C  SpO2: 100%    Last Pain:  Vitals:   09/13/17 0923  TempSrc: Tympanic         Complications: No apparent anesthesia complications

## 2017-09-13 NOTE — H&P (Addendum)
     Angel MoodKiran Becket Wecker, MD [address], [address], [place], [place], [address] [address], [address], [place], [place], [address] Phone: [phone]  Fax: [phone]  Primary Care Physician:  Glori LuisSonnenberg, Angel G, MD   Pre-Procedure History & Physical: HPI:  Angel Hudson is a 52 y.o. male is here for an colonoscopy.   Past Medical History:  Diagnosis Date  . Allergic rhinitis   . Chickenpox     No past surgical history on file.  Prior to Admission medications   Not on File    Allergies as of 08/19/2017  . (No Known Allergies)    Family History  Problem Relation Age of Onset  . Alcoholism Unknown   . Heart disease Unknown   . Stroke Unknown   . Diabetes Unknown     Social History   Socioeconomic History  . Marital status: Married    Spouse name: Not on file  . Number of children: Not on file  . Years of education: Not on file  . Highest education level: Not on file  Social Needs  . Financial resource strain: Not on file  . Food insecurity - worry: Not on file  . Food insecurity - inability: Not on file  . Transportation needs - medical: Not on file  . Transportation needs - non-medical: Not on file  Occupational History  . Not on file  Tobacco Use  . Smoking status: Former Games developermoker  . Smokeless tobacco: Never Used  Substance and Sexual Activity  . Alcohol use: Yes    Alcohol/week: 0.0 oz    Comment: Rarely  . Drug use: No  . Sexual activity: Not on file  Other Topics Concern  . Not on file  Social History Narrative  . Not on file    Review of Systems: See HPI, otherwise negative ROS  Physical Exam: BP (!) 141/99   Pulse 73   Temp (!) 96.6 F (35.9 C) (Tympanic)   Resp 16   Ht 5\' 6"  (1.676 m)   Wt 200 lb (90.7 kg)   SpO2 100%   BMI 32.28 kg/m  General:   Alert,  pleasant and cooperative in NAD Head:  Normocephalic and atraumatic. Neck:  Supple; no masses or thyromegaly. Lungs:  Clear throughout to auscultation, normal respiratory  effort.    Heart:  +S1, +S2, Regular rate and rhythm, No edema. Abdomen:  Soft, nontender and nondistended. Normal bowel sounds, without guarding, and without rebound.   Neurologic:  Alert and  oriented x4;  grossly normal neurologically.  Impression/Plan: Angel Hudson is here for an colonoscopy to be performed for Screening colonoscopy average risk   Risks, benefits, limitations, and alternatives regarding  colonoscopy have been reviewed with the patient.  Questions have been answered.  All parties agreeable.   Angel MoodKiran Dilynn Munroe, MD  09/13/2017, 10:00 AM

## 2017-09-13 NOTE — Anesthesia Procedure Notes (Signed)
Date/Time: 09/13/2017 10:20 AM Performed by: Henrietta HooverPope, Larence Thone, CRNA Pre-anesthesia Checklist: Patient identified, Emergency Drugs available, Suction available, Patient being monitored and Timeout performed Patient Re-evaluated:Patient Re-evaluated prior to induction Oxygen Delivery Method: Nasal cannula Placement Confirmation: positive ETCO2

## 2017-09-13 NOTE — Anesthesia Postprocedure Evaluation (Signed)
Anesthesia Post Note  Patient: Merrily Pewhomas Wehling  Procedure(s) Performed: COLONOSCOPY WITH PROPOFOL (N/A )  Patient location during evaluation: Endoscopy Anesthesia Type: General Level of consciousness: awake and alert and oriented Pain management: pain level controlled Vital Signs Assessment: post-procedure vital signs reviewed and stable Respiratory status: spontaneous breathing, nonlabored ventilation and respiratory function stable Cardiovascular status: blood pressure returned to baseline and stable Postop Assessment: no signs of nausea or vomiting Anesthetic complications: no     Last Vitals:  Vitals:   09/13/17 1047 09/13/17 1057  BP: (!) 113/96 (!) 132/93  Pulse: 76 75  Resp: 15 17  Temp:    SpO2: 97% 98%    Last Pain:  Vitals:   09/13/17 1037  TempSrc: Tympanic                 Amrita Radu

## 2017-09-14 ENCOUNTER — Encounter: Payer: Self-pay | Admitting: Gastroenterology

## 2017-09-14 NOTE — Telephone Encounter (Signed)
Noted  

## 2017-09-14 NOTE — Telephone Encounter (Signed)
Left message to return call, ok for pec to speak to patient and notify him of message below 

## 2017-09-14 NOTE — Telephone Encounter (Signed)
Notified pt of msg below - he has not called ins yet bc he had a colonoscopy yesterday - he will call to see what is covered and call back.

## 2017-09-14 NOTE — Telephone Encounter (Signed)
fyi

## 2017-09-28 ENCOUNTER — Ambulatory Visit: Payer: Managed Care, Other (non HMO) | Admitting: *Deleted

## 2017-09-28 VITALS — BP 122/88 | HR 74 | Resp 18

## 2017-09-28 DIAGNOSIS — R030 Elevated blood-pressure reading, without diagnosis of hypertension: Secondary | ICD-10-CM

## 2017-09-29 ENCOUNTER — Encounter: Payer: Self-pay | Admitting: *Deleted

## 2017-09-29 ENCOUNTER — Telehealth: Payer: Self-pay | Admitting: Family Medicine

## 2017-09-29 NOTE — Telephone Encounter (Signed)
Please advise, did you fax this?

## 2017-09-29 NOTE — Progress Notes (Signed)
Patient present ed for BP check to left arm BP 122/88 pulse 74 patient was due to come in One week form last OV patient stated he had not been able to come in . Patient also wanted to inform PCP that he still needed some paper work for his CPAP but could not remember exactly what was needed so patient will call advise after speaking with insurance,

## 2017-09-29 NOTE — Telephone Encounter (Signed)
Please advise 

## 2017-09-29 NOTE — Telephone Encounter (Signed)
Copied from CRM [id number]. Topic: Quick Communication - See Telephone Encounter >> Sep 29, 2017  8:49 AM Rudi CocoLathan, Stormie Ventola M, NT wrote: CRM for notification. See Telephone encounter for: 09/29/17.  Misty StanleyStacey from [facility] called and said they received the fax for the order for the sleep study but there wasn't an rx sent. Misty StanleyStacey can be reached at [phone] ext.[phone]

## 2017-09-30 ENCOUNTER — Telehealth: Payer: Self-pay | Admitting: *Deleted

## 2017-09-30 NOTE — Progress Notes (Signed)
Left message for patient to return call to office. 

## 2017-09-30 NOTE — Telephone Encounter (Signed)
Left message for patient to return call to office please advise patient of PCP orders pasted below. PEC nurse may advise.   Attestation signed by Glori LuisSonnenberg, Eric G, MD at 09/30/2017 10:16 AM  Diastolic blood pressure is above goal.  I would recommend adding a low-dose of amlodipine if he is willing.  I would start with amlodipine 5 mg once daily by mouth.  90 tablets.  3 refills.  He would need a blood pressure check in 1 month if he starts on medication.  Needs follow-up with me in 3 months.  Marikay AlarEric Sonnenberg, MD

## 2017-09-30 NOTE — Progress Notes (Signed)
See phone note

## 2017-09-30 NOTE — Telephone Encounter (Signed)
NT line is busy. Please call pt back and advise of the below. CB#: [phone]

## 2017-09-30 NOTE — Telephone Encounter (Signed)
Patient called, left VM to return call for advice from Dr. Birdie SonsSonnenberg.

## 2017-10-05 NOTE — Progress Notes (Signed)
Patient stated he would like to wait until he gets his CPAP and supplies and starts getting a good nights sleep and see if the lack of sleep is what is effecting his BP.

## 2017-10-05 NOTE — Telephone Encounter (Signed)
Angel StanleyStacey is calling back checking on status of rx being sent to them. After 2 days the order will be placed on hold.

## 2017-10-05 NOTE — Telephone Encounter (Signed)
Left message for patient to return call for information regarding recent nurse visit for BP.

## 2017-10-05 NOTE — Telephone Encounter (Signed)
Yes it has been faxed with all the information that they needed.

## 2017-10-05 NOTE — Telephone Encounter (Signed)
Please advise, I have placed a fax that came in today on your desk, has this already been faxed?

## 2017-10-05 NOTE — Telephone Encounter (Signed)
Patient stated he would like to wait until he gets his CPAP and supplies and starts getting a good nights sleep and see if the lack of sleep is what is effecting his BP. Clincal note sent to PCP,

## 2017-10-07 NOTE — Progress Notes (Signed)
Noted  

## 2017-11-01 ENCOUNTER — Telehealth: Payer: Self-pay | Admitting: Family Medicine

## 2017-11-01 NOTE — Telephone Encounter (Signed)
Order form received for CPAP titration.  He needs to have an Epworth Sleepiness Scale completed.  Please contact him to complete the Epworth Sleepiness Scale over the phone and also print out a list of his medications to attach and once this is done I can complete the form.  I have placed this on Jessica's desk.

## 2017-11-01 NOTE — Telephone Encounter (Signed)
Left message to return call, please transfer patient to me or Patina once he is on the phone to complete epworth sleepiness scale

## 2017-11-04 NOTE — Telephone Encounter (Signed)
Left message to return call, please transfer patient to me or Patina once he is on the phone to complete epworth sleepiness scale 

## 2017-11-04 NOTE — Telephone Encounter (Signed)
Placed on your desk, patient is not taking any medications at this time

## 2017-11-04 NOTE — Telephone Encounter (Signed)
Patient called office back and patient answered all questions I informed him that I would give the form back to [facility] and have Dr. Birdie Sons sign it and then fax. Patient verbalized understanding.

## 2017-11-05 NOTE — Telephone Encounter (Signed)
Completed.  I will forward to [facility] so she can fax to sleep med.

## 2017-12-22 ENCOUNTER — Ambulatory Visit (INDEPENDENT_AMBULATORY_CARE_PROVIDER_SITE_OTHER): Payer: Managed Care, Other (non HMO) | Admitting: Podiatry

## 2017-12-22 ENCOUNTER — Encounter: Payer: Self-pay | Admitting: Podiatry

## 2017-12-22 ENCOUNTER — Ambulatory Visit (INDEPENDENT_AMBULATORY_CARE_PROVIDER_SITE_OTHER): Payer: Managed Care, Other (non HMO)

## 2017-12-22 ENCOUNTER — Other Ambulatory Visit: Payer: Self-pay | Admitting: Podiatry

## 2017-12-22 DIAGNOSIS — M778 Other enthesopathies, not elsewhere classified: Secondary | ICD-10-CM

## 2017-12-22 DIAGNOSIS — M722 Plantar fascial fibromatosis: Secondary | ICD-10-CM

## 2017-12-22 DIAGNOSIS — M779 Enthesopathy, unspecified: Principal | ICD-10-CM

## 2017-12-22 MED ORDER — MELOXICAM 15 MG PO TABS: 15.0000 mg | ORAL_TABLET | Freq: Every day | ORAL | 3 refills | Status: DC

## 2017-12-22 MED ORDER — METHYLPREDNISOLONE 4 MG PO TBPK: ORAL_TABLET | ORAL | 0 refills | Status: DC

## 2017-12-22 NOTE — Patient Instructions (Signed)

## 2017-12-22 NOTE — Progress Notes (Signed)
  Subjective:  Patient ID: Angel Hudson, male    DOB: [birth year],  MRN: [id number] HPI Chief Complaint  Patient presents with  . Foot Pain    Plantar heel left - aching x few months, knee pain more on left, no AM pain, tried advil prn  . New Patient (Initial Visit)    52 y.o. male presents with the above complaint.   ROS: Denies fever chills nausea vomiting muscle aches pains calf pain back pain chest pain shortness of breath.  Relates right knee pain.  Past Medical History:  Diagnosis Date  . Allergic rhinitis   . Chickenpox    Past Surgical History:  Procedure Laterality Date  . COLONOSCOPY WITH PROPOFOL N/A 09/13/2017   Procedure: COLONOSCOPY WITH PROPOFOL;  Surgeon: Wyline MoodAnna, Kiran, MD;  [facility];  Service: Gastroenterology;  Laterality: N/A;    Current Outpatient Medications:  .  ibuprofen (ADVIL,MOTRIN) 200 MG tablet, Take by mouth., Disp: , Rfl:  .  meloxicam (MOBIC) 15 MG tablet, Take 1 tablet (15 mg total) by mouth daily., Disp: 30 tablet, Rfl: 3 .  methylPREDNISolone (MEDROL DOSEPAK) 4 MG TBPK tablet, 6 day dose pack - take as directed, Disp: 21 tablet, Rfl: 0  No Known Allergies Review of Systems Objective:  There were no vitals filed for this visit.  General: Well developed, nourished, in no acute distress, alert and oriented x3   Dermatological: Skin is warm, dry and supple bilateral. Nails x 10 are well maintained; remaining integument appears unremarkable at this time. There are no open sores, no preulcerative lesions, no rash or signs of infection present.  Vascular: Dorsalis Pedis artery and Posterior Tibial artery pedal pulses are 2/4 bilateral with immedate capillary fill time. Pedal hair growth present. No varicosities and no lower extremity edema present bilateral.   Neruologic: Grossly intact via light touch bilateral. Vibratory intact via tuning fork bilateral. Protective threshold with Semmes Wienstein monofilament intact to all pedal  sites bilateral. Patellar and Achilles deep tendon reflexes 2+ bilateral. No Babinski or clonus noted bilateral.   Musculoskeletal: No gross boney pedal deformities bilateral. No pain, crepitus, or limitation noted with foot and ankle range of motion bilateral. Muscular strength 5/5 in all groups tested bilateral.  Pain on palpation medial calcaneal tubercle of the left heel some of the right heel but not nearly as sensitive.  Gait: Unassisted, Nonantalgic.    Radiographs:  Demonstrates a soft tissue increase in density plantar fashion calcaneal insertion site small plantar distally oriented calcaneal spur is noted.  No acute findings.  Assessment & Plan:   Assessment: Plantar fasciitis left.  Long-standing.  Plan: Discussed etiology pathology conservative versus surgical therapies.  Started him on a Medrol Dosepak to be followed by meloxicam.  Placed him in a plantar fascial brace after a heel injection.  20 mg of Kenalog 5 mg Marcaine after sterile Betadine skin prep tolerated procedure well.  Discussed appropriate shoe gear stretching exercise ice therapy sugar modifications.  I will follow-up with him in 1 month     Amira Podolak T. [place], Angel Hudson

## 2018-01-24 ENCOUNTER — Encounter: Payer: Self-pay | Admitting: Podiatry

## 2018-01-24 ENCOUNTER — Ambulatory Visit (INDEPENDENT_AMBULATORY_CARE_PROVIDER_SITE_OTHER): Payer: Managed Care, Other (non HMO) | Admitting: Podiatry

## 2018-01-24 DIAGNOSIS — M722 Plantar fascial fibromatosis: Secondary | ICD-10-CM

## 2018-01-24 NOTE — Progress Notes (Signed)
He presents today for follow-up of plantar fasciitis he states that he is 90% better but has not been using his brace nor has he been taking his anti-inflammatories.  Objective: Vital signs are stable he is alert and oriented x3.  He has no pain on palpation medial calcaneal tubercle of the left heel pulses remain strong and palpable.  Assessment: Resolution of plantar fasciitis left.  Plan: Encouraged him to continue to wear the night splint and plantar pressure brace and the take the meloxicam on a regular basis ~follow-up with him on an as-needed basis with recurrence or regression and consider orthotics at that time.

## 2018-05-13 ENCOUNTER — Encounter: Payer: Self-pay | Admitting: Family Medicine

## 2018-05-13 ENCOUNTER — Ambulatory Visit (INDEPENDENT_AMBULATORY_CARE_PROVIDER_SITE_OTHER): Payer: Managed Care, Other (non HMO) | Admitting: Family Medicine

## 2018-05-13 DIAGNOSIS — E785 Hyperlipidemia, unspecified: Secondary | ICD-10-CM | POA: Diagnosis not present

## 2018-05-13 DIAGNOSIS — R7303 Prediabetes: Secondary | ICD-10-CM | POA: Diagnosis not present

## 2018-05-13 DIAGNOSIS — G4733 Obstructive sleep apnea (adult) (pediatric): Secondary | ICD-10-CM

## 2018-05-13 LAB — POCT GLYCOSYLATED HEMOGLOBIN (HGB A1C): HEMOGLOBIN A1C: 5 % (ref 4.0–5.6)

## 2018-05-13 NOTE — Assessment & Plan Note (Signed)
Patient does not want to start on statin therapy.  Encourage diet and exercise.  Discussed dietary changes.

## 2018-05-13 NOTE — Assessment & Plan Note (Signed)
Continue CPAP.  Symptoms have improved.

## 2018-05-13 NOTE — Patient Instructions (Signed)
Nice to see you. Please work on diet and exercise. Please continue your CPAP.

## 2018-05-13 NOTE — Progress Notes (Signed)
  Marikay AlarEric , MD Phone: [phone]  Angel Hudson is a 52 y.o. male who presents today for follow-up.  CC: Hyperlipidemia, prediabetes, sleep apnea  Hyperlipidemia: He is not exercising.  Eats too much fried food.  He eats too late.  He does like greens.  Is drinking 1-2 sodas daily. No CP or SOB.  Prediabetes: Due for recheck A1c.  Sleep apnea: He uses his CPAP nightly 7 to 8 hours.  He does wake up well rested.  No hypersomnia.  Social History   Tobacco Use  Smoking Status Former Smoker  Smokeless Tobacco Never Used     ROS see history of present illness  Objective  Physical Exam Vitals:   05/13/18 1406  BP: 116/82  Pulse: 92  Resp: 15  Temp: 98.3 F (36.8 C)  SpO2: 94%    BP Readings from Last 3 Encounters:  05/13/18 116/82  09/29/17 122/88  09/13/17 (!) 132/93   Wt Readings from Last 3 Encounters:  05/13/18 201 lb 12.8 oz (91.5 kg)  09/13/17 200 lb (90.7 kg)  07/13/17 205 lb (93 kg)    Physical Exam  Constitutional: No distress.  Cardiovascular: Normal rate, regular rhythm and normal heart sounds.  Pulmonary/Chest: Effort normal and breath sounds normal.  Musculoskeletal: He exhibits no edema.  Neurological: He is alert.  Skin: Skin is warm and dry. He is not diaphoretic.     Assessment/Plan: Please see individual problem list.  Hyperlipidemia Patient does not want to start on statin therapy.  Encourage diet and exercise.  Discussed dietary changes.  Prediabetes Check A1c.  Discussed dietary changes and exercise.  OSA (obstructive sleep apnea) Continue CPAP.  Symptoms have improved.   Orders Placed This Encounter  Procedures  . POCT HgB A1C    No orders of the defined types were placed in this encounter.    Marikay AlarEric , MD [facility]

## 2018-05-13 NOTE — Assessment & Plan Note (Signed)
Check A1c.  Discussed dietary changes and exercise.

## 2018-11-30 ENCOUNTER — Other Ambulatory Visit: Payer: Self-pay

## 2018-11-30 ENCOUNTER — Ambulatory Visit (INDEPENDENT_AMBULATORY_CARE_PROVIDER_SITE_OTHER): Payer: Managed Care, Other (non HMO) | Admitting: Podiatry

## 2018-11-30 ENCOUNTER — Encounter: Payer: Self-pay | Admitting: Podiatry

## 2018-11-30 VITALS — Temp 98.2°F

## 2018-11-30 DIAGNOSIS — M722 Plantar fascial fibromatosis: Secondary | ICD-10-CM

## 2018-11-30 NOTE — Progress Notes (Signed)
He presents today for follow-up of his plantar fasciitis of his left foot states that is starting to bother him again.  Objective: Vital signs are stable alert and oriented x3.  Pulses are palpable.  Pain on palpation medial calcaneal tubercle of the left heel.  Assessment: Recurrent plantar fasciitis.  Plan: Reinjected left heel today with 20 mg Kenalog 5 mg of Marcaine for maximal tenderness.  We also talked to [place] and he will go ahead and set him up with a set of orthotics.

## 2018-12-21 ENCOUNTER — Other Ambulatory Visit: Payer: Self-pay

## 2018-12-21 ENCOUNTER — Ambulatory Visit: Payer: Managed Care, Other (non HMO) | Admitting: Orthotics

## 2018-12-21 DIAGNOSIS — M722 Plantar fascial fibromatosis: Secondary | ICD-10-CM

## 2018-12-21 NOTE — Progress Notes (Signed)
Patient came in today to p/up functional foot orthotics.   The orthotics were assessed to both fit and function.  The F/O addressed the biomechanical issues/pathologies as intended, offering good longitudinal arch support, proper offloading, and foot support. There weren't any signs of discomfort or irritation.  The F/O fit properly in footwear with minimal trimming/adjustments. 

## 2018-12-29 ENCOUNTER — Other Ambulatory Visit: Payer: Self-pay | Admitting: *Deleted

## 2018-12-29 MED ORDER — MELOXICAM 15 MG PO TABS: 15.0000 mg | ORAL_TABLET | Freq: Every day | ORAL | 3 refills | Status: DC

## 2018-12-29 NOTE — Telephone Encounter (Signed)
Sent refill meloxicam to pharmacy

## 2019-01-03 ENCOUNTER — Telehealth: Payer: Self-pay | Admitting: *Deleted

## 2019-01-03 NOTE — Telephone Encounter (Signed)
Pt requested refill of the meloxicam.

## 2019-01-04 NOTE — Telephone Encounter (Signed)
Patient notified via voice mail that rx for Meloxicam was sent to [facility] [address] on 12/29/2018.

## 2019-01-18 ENCOUNTER — Ambulatory Visit: Payer: Managed Care, Other (non HMO) | Admitting: Podiatry

## 2021-04-13 ENCOUNTER — Other Ambulatory Visit: Payer: Self-pay

## 2021-04-13 DIAGNOSIS — S199XXA Unspecified injury of neck, initial encounter: Secondary | ICD-10-CM | POA: Diagnosis present

## 2021-04-13 DIAGNOSIS — X58XXXA Exposure to other specified factors, initial encounter: Secondary | ICD-10-CM | POA: Diagnosis not present

## 2021-04-13 DIAGNOSIS — S161XXA Strain of muscle, fascia and tendon at neck level, initial encounter: Secondary | ICD-10-CM | POA: Insufficient documentation

## 2021-04-13 DIAGNOSIS — R001 Bradycardia, unspecified: Secondary | ICD-10-CM | POA: Diagnosis not present

## 2021-04-13 DIAGNOSIS — Z87891 Personal history of nicotine dependence: Secondary | ICD-10-CM | POA: Diagnosis not present

## 2021-04-13 LAB — CBC WITH DIFFERENTIAL/PLATELET
Abs Immature Granulocytes: 0.01 10*3/uL (ref 0.00–0.07)
Basophils Absolute: 0.1 10*3/uL (ref 0.0–0.1)
Basophils Relative: 1 %
Eosinophils Absolute: 0.4 10*3/uL (ref 0.0–0.5)
Eosinophils Relative: 4 %
HCT: 45.4 % (ref 39.0–52.0)
Hemoglobin: 16.1 g/dL (ref 13.0–17.0)
Immature Granulocytes: 0 %
Lymphocytes Relative: 44 %
Lymphs Abs: 3.6 10*3/uL (ref 0.7–4.0)
MCH: 32.1 pg (ref 26.0–34.0)
MCHC: 35.5 g/dL (ref 30.0–36.0)
MCV: 90.6 fL (ref 80.0–100.0)
Monocytes Absolute: 0.5 10*3/uL (ref 0.1–1.0)
Monocytes Relative: 7 %
Neutro Abs: 3.5 10*3/uL (ref 1.7–7.7)
Neutrophils Relative %: 44 %
Platelets: 237 10*3/uL (ref 150–400)
RBC: 5.01 MIL/uL (ref 4.22–5.81)
RDW: 13.1 % (ref 11.5–15.5)
WBC: 8 10*3/uL (ref 4.0–10.5)
nRBC: 0 % (ref 0.0–0.2)

## 2021-04-13 NOTE — ED Triage Notes (Signed)
Pt states he vomited three weeks ago pulling his right neck. Pt states has continued to have pain. Pt without nuchal rigidity, no rash or fever noted. Cms intact.

## 2021-04-14 ENCOUNTER — Emergency Department
Admission: EM | Admit: 2021-04-14 | Discharge: 2021-04-14 | Disposition: A | Discharge status: Home / Self Care | Payer: PRIVATE HEALTH INSURANCE | Attending: Emergency Medicine | Admitting: Emergency Medicine

## 2021-04-14 DIAGNOSIS — S161XXA Strain of muscle, fascia and tendon at neck level, initial encounter: Secondary | ICD-10-CM | POA: Diagnosis not present

## 2021-04-14 DIAGNOSIS — M62838 Other muscle spasm: Secondary | ICD-10-CM

## 2021-04-14 LAB — BASIC METABOLIC PANEL
Anion gap: 10 (ref 5–15)
BUN: 15 mg/dL (ref 6–20)
CO2: 24 mmol/L (ref 22–32)
Calcium: 9.2 mg/dL (ref 8.9–10.3)
Chloride: 103 mmol/L (ref 98–111)
Creatinine, Ser: 0.87 mg/dL (ref 0.61–1.24)
GFR, Estimated: >60 mL/min (ref >60)
Glucose, Bld: 119 mg/dL — ABNORMAL HIGH (ref 70–99)
Potassium: 3.9 mmol/L (ref 3.5–5.1)
Sodium: 137 mmol/L (ref 135–145)

## 2021-04-14 MED ORDER — CYCLOBENZAPRINE HCL 5 MG PO TABS: 5.0000 mg | ORAL_TABLET | Freq: Three times a day (TID) | ORAL | 0 refills | Status: DC | PRN

## 2021-04-14 MED ORDER — KETOROLAC TROMETHAMINE 30 MG/ML IJ SOLN
30.0000 mg | Freq: Once | INTRAMUSCULAR | Status: AC
  Administered 2021-04-14: 30 mg via INTRAMUSCULAR
  Filled 2021-04-14: qty 1

## 2021-04-14 NOTE — ED Provider Notes (Signed)
[facility] Emergency Department Provider Note   ____________________________________________   Event Date/Time   First MD Initiated Contact with Patient 04/14/21 0151     (approximate)  I have reviewed the triage vital signs and the nursing notes.   HISTORY  Chief Complaint Neck Pain    HPI Angel Hudson is a 55 y.o. male with past medical history of hyperlipidemia and GERD who presents to the ED complaining of neck pain.  Patient reports that he initially had onset of right-sided neck pain after he went to vomit.  He states that he has been dealing with a sharp pain in the right side of his neck almost constantly since then.  It seems to wax and wane in severity, is worse with certain movements of his head and neck.  He feels like the muscles will spasm at times, but he denies any fevers or neck stiffness.  He has not had any numbness or weakness in his extremities and he denies any pain in his chest.  He has been taking ibuprofen without significant relief.  He denies any prior history of neck pain.        Past Medical History:  Diagnosis Date   Allergic rhinitis    Chickenpox     Patient Active Problem List   Diagnosis Date Noted   Prediabetes 05/13/2018   OSA (obstructive sleep apnea) 07/13/2017   Elevated blood pressure reading 07/13/2017   Accessory skin tags 03/09/2016   Actinic keratosis of left temple 03/09/2016   Hyperlipidemia 12/26/2015   GERD (gastroesophageal reflux disease) 12/26/2015   Family history of BRCA1 gene positive 10/10/2015   Anxiety and depression 09/26/2015   Encounter for general adult medical examination with abnormal findings 09/26/2015   Obesity 09/25/2015    Past Surgical History:  Procedure Laterality Date   COLONOSCOPY WITH PROPOFOL N/A 09/13/2017   Procedure: COLONOSCOPY WITH PROPOFOL;  Surgeon: Jonathon Bellows, MD;  Location: [facility] ENDOSCOPY;  Service: Gastroenterology;  Laterality: N/A;    Prior to  Admission medications   Medication Sig Start Date End Date Taking? Authorizing Provider  cyclobenzaprine (FLEXERIL) 5 MG tablet Take 1 tablet (5 mg total) by mouth 3 (three) times daily as needed. 04/14/21  Yes Blake Divine, MD  ibuprofen (ADVIL,MOTRIN) 200 MG tablet Take by mouth.    [provider]  meloxicam (MOBIC) 15 MG tablet Take 1 tablet (15 mg total) by mouth daily. 12/29/18   Hyatt, Max T, DPM    Allergies Patient has no known allergies.  Family History  Problem Relation Age of Onset   Alcoholism Unknown    Heart disease Unknown    Stroke Unknown    Diabetes Unknown     Social History Social History   Tobacco Use   Smoking status: Former   Smokeless tobacco: Never  Substance Use Topics   Alcohol use: Yes    Alcohol/week: 0.0 standard drinks    Comment: Rarely   Drug use: No    Review of Systems  Constitutional: No fever/chills Eyes: No visual changes. ENT: No sore throat. Cardiovascular: Denies chest pain. Respiratory: Denies shortness of breath. Gastrointestinal: No abdominal pain.  No nausea, no vomiting.  No diarrhea.  No constipation. Genitourinary: Negative for dysuria. Musculoskeletal: Negative for back pain.  Positive for neck pain. Skin: Negative for rash. Neurological: Negative for headaches, focal weakness or numbness.  ____________________________________________   PHYSICAL EXAM:  VITAL SIGNS: ED Triage Vitals  Enc Vitals Group     BP 04/13/21 2301 Marland Kitchen)  178/112     Pulse Rate 04/13/21 2301 60     Resp 04/13/21 2301 18     Temp 04/13/21 2301 97.6 F (36.4 C)     Temp Source 04/13/21 2301 Oral     SpO2 04/13/21 2301 98 %     Weight 04/13/21 2302 200 lb (90.7 kg)     Height 04/13/21 2302 _0  (1.676 m)     Head Circumference --      Peak Flow --      Pain Score 04/13/21 2304 3     Pain Loc --      Pain Edu? --      Excl. in [place]? --     Constitutional: Alert and oriented. Eyes: Conjunctivae are normal. Head:  Atraumatic. Nose: No congestion/rhinnorhea. Mouth/Throat: Mucous membranes are moist. Neck: Normal ROM, no midline cervical spine tenderness to palpation.  Right paraspinal tenderness to palpation noted. Cardiovascular: Normal rate, regular rhythm. Grossly normal heart sounds.  2+ radial pulses bilaterally. Respiratory: Normal respiratory effort.  No retractions. Lungs CTAB. Gastrointestinal: Soft and nontender. No distention. Genitourinary: deferred Musculoskeletal: No lower extremity tenderness nor edema. Neurologic:  Normal speech and language. No gross focal neurologic deficits are appreciated. Skin:  Skin is warm, dry and intact. No rash noted. Psychiatric: Mood and affect are normal. Speech and behavior are normal.  ____________________________________________   LABS (all labs ordered are listed, but only abnormal results are displayed)  Labs Reviewed  BASIC METABOLIC PANEL - Abnormal; Notable for the following components:      Result Value   Glucose, Bld 119 (*)    All other components within normal limits  CBC WITH DIFFERENTIAL/PLATELET   ____________________________________________  EKG  ED ECG REPORT I, Blake Divine, the attending physician, personally viewed and interpreted this ECG.   Date: 04/14/2021  EKG Time: 23:41  Rate: 57  Rhythm: sinus bradycardia  Axis: Normal  Intervals: Incomplete RBBB  ST&T Change: None   PROCEDURES  Procedure(s) performed (including Critical Care):  Procedures   ____________________________________________   INITIAL IMPRESSION / ASSESSMENT AND PLAN / ED COURSE      55 year old male with past medical history of hyperlipidemia and GERD presents to the ED complaining of approximately 3 weeks of waxing and waning pain in the right side of his neck that is worse with certain movements of his head and neck.  I doubt cardiac etiology for his symptoms, EKG shows no evidence of arrhythmia or ischemia.  Additional labs are  unremarkable and symptoms seem consistent with a cervical strain with associated muscle spasm.  No meningismus noted and patient has no neurologic deficits to suggest significant cervical stenosis.  We will treat with Toradol, Lidoderm patches, and muscle relaxants.  Patient is appropriate for discharge home with PCP follow-up, was counseled to return to the ED for new or worsening symptoms.  Patient agrees with plan.      ____________________________________________   FINAL CLINICAL IMPRESSION(S) / ED DIAGNOSES  Final diagnoses:  Acute strain of neck muscle, initial encounter  Muscle spasm     ED Discharge Orders          Ordered    cyclobenzaprine (FLEXERIL) 5 MG tablet  3 times daily PRN        04/14/21 0246             Note:  This document was prepared using Dragon voice recognition software and may include unintentional dictation errors.    Blake Divine, MD 04/14/21 [phone]

## 2021-04-15 ENCOUNTER — Ambulatory Visit
Admission: RE | Admit: 2021-04-15 | Discharge: 2021-04-15 | Disposition: A | Discharge status: Home / Self Care | Payer: PRIVATE HEALTH INSURANCE | Source: Ambulatory Visit | Attending: Family | Admitting: Family

## 2021-04-15 ENCOUNTER — Other Ambulatory Visit: Payer: Self-pay

## 2021-04-15 ENCOUNTER — Ambulatory Visit (INDEPENDENT_AMBULATORY_CARE_PROVIDER_SITE_OTHER): Payer: No Typology Code available for payment source | Admitting: Family

## 2021-04-15 ENCOUNTER — Encounter: Payer: Self-pay | Admitting: Family

## 2021-04-15 VITALS — BP 132/98 | HR 87 | Temp 96.8°F | Ht 65.98 in | Wt 205.8 lb

## 2021-04-15 DIAGNOSIS — M5412 Radiculopathy, cervical region: Secondary | ICD-10-CM | POA: Diagnosis present

## 2021-04-15 DIAGNOSIS — M542 Cervicalgia: Secondary | ICD-10-CM | POA: Diagnosis present

## 2021-04-15 MED ORDER — CYCLOBENZAPRINE HCL 10 MG PO TABS: 10.0000 mg | ORAL_TABLET | Freq: Three times a day (TID) | ORAL | 0 refills | Status: DC | PRN

## 2021-04-15 MED ORDER — PREDNISONE 20 MG PO TABS: 20.0000 mg | ORAL_TABLET | Freq: Every day | ORAL | 0 refills | Status: DC

## 2021-04-15 NOTE — Progress Notes (Signed)
Acute Office Visit  Subjective:    Patient ID: Angel Hudson, male    DOB: [birth year], 55 y.o.   MRN: [id number]  Chief Complaint  Patient presents with  . Neck Pain    HPI Patient is in today as a hospital follow-up from yesterday where he was seen for severe neck pain. The pain has been ongoing x 3 weeks but worsened over the weekend. Reports vomiting that caused the pain to increase. Laying flat also worsens the pain. Is currently taking Ibuprofen and Flexeril that has helped some. Continued to have decreased range of motion. Can sometimes feel numbness into his right hand. Denies any pmhx of neck pain or injury.   Past Medical History:  Diagnosis Date  . Allergic rhinitis   . Chickenpox     Past Surgical History:  Procedure Laterality Date  . COLONOSCOPY WITH PROPOFOL N/A 09/13/2017   Procedure: COLONOSCOPY WITH PROPOFOL;  Surgeon: Wyline Mood, MD;  Location: [facility] ENDOSCOPY;  Service: Gastroenterology;  Laterality: N/A;    Family History  Problem Relation Age of Onset  . Alcoholism Unknown   . Heart disease Unknown   . Stroke Unknown   . Diabetes Unknown     Social History   Socioeconomic History  . Marital status: Married    Spouse name: Not on file  . Number of children: Not on file  . Years of education: Not on file  . Highest education level: Not on file  Occupational History  . Not on file  Tobacco Use  . Smoking status: Former  . Smokeless tobacco: Never  Substance and Sexual Activity  . Alcohol use: Yes    Alcohol/week: 0.0 standard drinks    Comment: Rarely  . Drug use: No  . Sexual activity: Not on file  Other Topics Concern  . Not on file  Social History Narrative  . Not on file   Social Determinants of Health   Financial Resource Strain: Not on file  Food Insecurity: Not on file  Transportation Needs: Not on file  Physical Activity: Not on file  Stress: Not on file  Social Connections: Not on file  Intimate Partner Violence: Not on  file    Outpatient Medications Prior to Visit  Medication Sig Dispense Refill  . ibuprofen (ADVIL,MOTRIN) 200 MG tablet Take by mouth.    . meloxicam (MOBIC) 15 MG tablet Take 1 tablet (15 mg total) by mouth daily. 30 tablet 3  . cyclobenzaprine (FLEXERIL) 5 MG tablet Take 1 tablet (5 mg total) by mouth 3 (three) times daily as needed. 12 tablet 0   No facility-administered medications prior to visit.    No Known Allergies  Review of Systems  Constitutional: Negative.   HENT: Negative.    Respiratory: Negative.    Cardiovascular: Negative.   Gastrointestinal: Negative.   Endocrine: Negative.   Musculoskeletal:  Positive for neck pain and neck stiffness.  Skin: Negative.   Allergic/Immunologic: Negative.   Neurological:  Positive for numbness. Negative for dizziness and light-headedness.  Hematological: Negative.   Psychiatric/Behavioral: Negative.        Objective:    Physical Exam Vitals and nursing note reviewed.  Constitutional:      Appearance: Normal appearance.  Neck:     Comments: Decreased ROM due to pain. Pain relieved with lifting the the the head. Pain worsened when turning head to the right and has some relief when head turns to the left  Cardiovascular:     Rate and Rhythm: Normal rate  and regular rhythm.  Pulmonary:     Effort: Pulmonary effort is normal.     Breath sounds: Normal breath sounds.  Musculoskeletal:        General: Normal range of motion.     Cervical back: Neck supple. Tenderness present.  Skin:    General: Skin is warm and dry.  Neurological:     General: No focal deficit present.     Mental Status: He is alert and oriented to person, place, and time. Mental status is at baseline.     Cranial Nerves: No cranial nerve deficit.     Sensory: No sensory deficit.     Motor: No weakness.     Coordination: Coordination normal.  Psychiatric:        Mood and Affect: Mood normal.        Behavior: Behavior normal.   BP (!) 132/98   Pulse  87   Temp (!) 96.8 F (36 C)   Ht 5' 5.98" (1.676 m)   Wt 205 lb 12.8 oz (93.4 kg)   SpO2 98%   BMI 33.23 kg/m  Wt Readings from Last 3 Encounters:  04/15/21 205 lb 12.8 oz (93.4 kg)  04/13/21 200 lb (90.7 kg)  05/13/18 201 lb 12.8 oz (91.5 kg)    Health Maintenance Due  Topic Date Due  . COVID-19 Vaccine (1) Never done  . HIV Screening  Never done  . Hepatitis C Screening  Never done  . TETANUS/TDAP  Never done  . Zoster Vaccines- Shingrix (1 of 2) Never done  . INFLUENZA VACCINE  01/27/2021    There are no preventive care reminders to display for this patient.   Lab Results  Component Value Date   TSH 1.940 09/30/2015   Lab Results  Component Value Date   WBC 8.0 04/13/2021   HGB 16.1 04/13/2021   HCT 45.4 04/13/2021   MCV 90.6 04/13/2021   PLT 237 04/13/2021   Lab Results  Component Value Date   NA 137 04/13/2021   K 3.9 04/13/2021   CO2 24 04/13/2021   GLUCOSE 119 (H) 04/13/2021   BUN 15 04/13/2021   CREATININE 0.87 04/13/2021   BILITOT 0.4 07/20/2017   ALKPHOS 77 07/20/2017   AST 17 07/20/2017   ALT 31 07/20/2017   PROT 7.2 07/20/2017   ALBUMIN 4.5 07/20/2017   CALCIUM 9.2 04/13/2021   ANIONGAP 10 04/13/2021   Lab Results  Component Value Date   CHOL 187 07/20/2017   Lab Results  Component Value Date   HDL 32 (L) 07/20/2017   Lab Results  Component Value Date   LDLCALC 122 (H) 07/20/2017   Lab Results  Component Value Date   TRIG 165 (H) 07/20/2017   No results found for: CHOLHDL Lab Results  Component Value Date   HGBA1C 5.0 05/13/2018       Assessment & Plan:   Problem List Items Addressed This Visit   None Visit Diagnoses     Neck pain    -  Primary   Relevant Orders   DG Cervical Spine Complete   Cervical radiculopathy       Relevant Medications   cyclobenzaprine (FLEXERIL) 10 MG tablet   Other Relevant Orders   DG Cervical Spine Complete        Meds ordered this encounter  Medications  . cyclobenzaprine  (FLEXERIL) 10 MG tablet    Sig: Take 1 tablet (10 mg total) by mouth 3 (three) times daily as needed for muscle spasms.  Dispense:  30 tablet    Refill:  0  . predniSONE (DELTASONE) 20 MG tablet    Sig: Take 1 tablet (20 mg total) by mouth daily with breakfast. 60mg  po q am x 3 days, 40 mg po qam x 3 days, 20 mg po qam x 3 days    Dispense:  18 tablet    Refill:  0   Plan: Patient likely has a bulging disc. We will try conservative measures to include Prednisone and Flexeril. Xray ordered. Discussed the possibility of physical therapy or ultimately CT/MRI if symptoms persist. Patient agreeable to plan. Rest, Ice,  , FNP

## 2021-04-17 ENCOUNTER — Other Ambulatory Visit: Payer: Self-pay | Admitting: Family

## 2021-04-17 DIAGNOSIS — M5030 Other cervical disc degeneration, unspecified cervical region: Secondary | ICD-10-CM

## 2021-04-21 ENCOUNTER — Other Ambulatory Visit: Payer: Self-pay | Admitting: Family

## 2021-04-21 DIAGNOSIS — M5030 Other cervical disc degeneration, unspecified cervical region: Secondary | ICD-10-CM

## 2021-04-21 DIAGNOSIS — M542 Cervicalgia: Secondary | ICD-10-CM

## 2021-05-02 ENCOUNTER — Telehealth: Payer: Self-pay | Admitting: Family Medicine

## 2021-05-02 NOTE — Telephone Encounter (Signed)
"  Rejection Reason - Other - Pt will have new insurance soon. He would like to hold off on scheduling for now" Angel Hudson said on May 02, 2021 3:05 PM  Msg from [facility] orthopedic surgery

## 2021-06-02 ENCOUNTER — Ambulatory Visit (INDEPENDENT_AMBULATORY_CARE_PROVIDER_SITE_OTHER): Payer: No Typology Code available for payment source | Admitting: Family Medicine

## 2021-06-02 ENCOUNTER — Encounter: Payer: Self-pay | Admitting: Family Medicine

## 2021-06-02 ENCOUNTER — Other Ambulatory Visit: Payer: Self-pay

## 2021-06-02 VITALS — BP 130/90 | HR 85 | Temp 98.6°F | Ht 65.0 in | Wt 209.0 lb

## 2021-06-02 DIAGNOSIS — Z114 Encounter for screening for human immunodeficiency virus [HIV]: Secondary | ICD-10-CM

## 2021-06-02 DIAGNOSIS — R030 Elevated blood-pressure reading, without diagnosis of hypertension: Secondary | ICD-10-CM

## 2021-06-02 DIAGNOSIS — Z13220 Encounter for screening for lipoid disorders: Secondary | ICD-10-CM

## 2021-06-02 DIAGNOSIS — H6983 Other specified disorders of Eustachian tube, bilateral: Secondary | ICD-10-CM

## 2021-06-02 DIAGNOSIS — Z0001 Encounter for general adult medical examination with abnormal findings: Secondary | ICD-10-CM

## 2021-06-02 DIAGNOSIS — Z1159 Encounter for screening for other viral diseases: Secondary | ICD-10-CM

## 2021-06-02 DIAGNOSIS — E669 Obesity, unspecified: Secondary | ICD-10-CM

## 2021-06-02 DIAGNOSIS — Z5320 Procedure and treatment not carried out because of patient's decision for unspecified reasons: Secondary | ICD-10-CM

## 2021-06-02 DIAGNOSIS — Z23 Encounter for immunization: Secondary | ICD-10-CM | POA: Diagnosis not present

## 2021-06-02 DIAGNOSIS — H6980 Other specified disorders of Eustachian tube, unspecified ear: Secondary | ICD-10-CM | POA: Insufficient documentation

## 2021-06-02 DIAGNOSIS — Z125 Encounter for screening for malignant neoplasm of prostate: Secondary | ICD-10-CM

## 2021-06-02 NOTE — Assessment & Plan Note (Signed)
Blood pressure elevated today.  We will have him return in 2 weeks for recheck.  If still elevated at that time would consider starting on blood pressure medication.

## 2021-06-02 NOTE — Assessment & Plan Note (Signed)
Physical exam completed.  Encouraged healthy diet and exercise.  Discussed adding in 2 to 3 days a week of additional exercise.  Discussed lean meats and plenty of fruits and vegetables.  Discussed reducing soda and sweet tea.  Prostate cancer screening with PSA today.  HIV and hepatitis C screening completed today.  Tetanus vaccine given today.  He was counseled on the Shingrix vaccine and he will check with his insurance when he gets a new plan in January.  Lab work as outlined.  Blood pressure slightly elevated today.  We will have him return in a couple of weeks for recheck and to discuss the headache.  He was counseled on the definition of a physical exam and what is included in this.  He was advised that he may end up with an additional charge if we discussed things outside of the scope of a physical exam.  Given this he opted to defer any further discussion on his headache until follow-up.

## 2021-06-02 NOTE — Assessment & Plan Note (Signed)
Suspect eustachian tube dysfunction.  Encouraged to try Allegra or Zyrtec.

## 2021-06-02 NOTE — Patient Instructions (Signed)
Nice to see you. We will get lab work today and contact you with the results. 

## 2021-06-02 NOTE — Progress Notes (Signed)
Tommi Rumps, MD Phone: [phone]  Angel Hudson is a 55 y.o. male who presents today for cpe.  Diet: not the best, does like fresh vegetables, though his wife does not like them that much, too much soda and sweet tea Exercise: active at work, though not much outside of work Colonoscopy: 09/13/17 10 year recall Prostate cancer screening: due Family history-  Prostate cancer: no  Colon cancer: no Vaccines-   Flu: UTD  Tetanus: due  Shingles: due  COVID19: x2 HIV screening: due Hep C Screening: due Tobacco use: no Alcohol use: few per week Illicit Drug use: no Dentist: yes Ophthalmology: yes Patient notes he has been advised at other offices that his blood pressure has been elevated.  He notes occasionally having a headache that he feels is related to his blood pressure. Notes his left ear feels stopped up at times.   Active Ambulatory Problems    Diagnosis Date Noted   Obesity 09/25/2015   Anxiety and depression 09/26/2015   Encounter for general adult medical examination with abnormal findings 09/26/2015   Family history of BRCA1 gene positive 10/10/2015   Hyperlipidemia 12/26/2015   GERD (gastroesophageal reflux disease) 12/26/2015   Accessory skin tags 03/09/2016   Actinic keratosis of left temple 03/09/2016   OSA (obstructive sleep apnea) 07/13/2017   Elevated blood pressure reading 07/13/2017   Prediabetes 05/13/2018   Eustachian tube dysfunction 06/02/2021   Resolved Ambulatory Problems    Diagnosis Date Noted   Acute bronchitis 10/10/2015   Past Medical History:  Diagnosis Date   Allergic rhinitis    Chickenpox     Family History  Problem Relation Age of Onset   Alcoholism Unknown    Heart disease Unknown    Stroke Unknown    Diabetes Unknown     Social History   Socioeconomic History   Marital status: Married    Spouse name: Not on file   Number of children: Not on file   Years of education: Not on file   Highest education level: Not  on file  Occupational History   Not on file  Tobacco Use   Smoking status: Former   Smokeless tobacco: Never  Substance and Sexual Activity   Alcohol use: Yes    Alcohol/week: 0.0 standard drinks    Comment: Rarely   Drug use: No   Sexual activity: Not on file  Other Topics Concern   Not on file  Social History Narrative   Not on file   Social Determinants of Health   Financial Resource Strain: Not on file  Food Insecurity: Not on file  Transportation Needs: Not on file  Physical Activity: Not on file  Stress: Not on file  Social Connections: Not on file  Intimate Partner Violence: Not on file    ROS  General:  Negative for nexplained weight loss, fever Skin: Negative for new or changing mole, sore that won't heal HEENT: Negative for trouble hearing, trouble seeing, ringing in ears, mouth sores, hoarseness, change in voice, dysphagia. CV:  Negative for chest pain, dyspnea, edema, palpitations Resp: Negative for cough, dyspnea, hemoptysis GI: Negative for nausea, vomiting, diarrhea, constipation, abdominal pain, melena, hematochezia. GU: Negative for dysuria, incontinence, urinary hesitance, hematuria, vaginal or penile discharge, polyuria, sexual difficulty, lumps in testicle or breasts MSK: Negative for muscle cramps or aches, joint pain or swelling Neuro: Positive for headaches, negative for weakness, numbness, dizziness, passing out/fainting Psych: Negative for depression, anxiety, memory problems  Objective  Physical Exam Vitals:   06/02/21  1034  BP: 130/90  Pulse: 85  Temp: 98.6 F (37 C)  SpO2: 99%    BP Readings from Last 3 Encounters:  06/02/21 130/90  04/15/21 (!) 132/98  04/14/21 (!) 140/92   Wt Readings from Last 3 Encounters:  06/02/21 209 lb (94.8 kg)  04/15/21 205 lb 12.8 oz (93.4 kg)  04/13/21 200 lb (90.7 kg)    Physical Exam Constitutional:      General: He is not in acute distress.    Appearance: He is not diaphoretic.  HENT:      Head: Normocephalic and atraumatic.     Ears:     Comments: Bilateral TMs with no erythema, there is clear fluid behind both TMs Eyes:     Conjunctiva/sclera: Conjunctivae normal.     Pupils: Pupils are equal, round, and reactive to light.  Cardiovascular:     Rate and Rhythm: Normal rate and regular rhythm.     Heart sounds: Normal heart sounds.  Pulmonary:     Effort: Pulmonary effort is normal.     Breath sounds: Normal breath sounds.  Abdominal:     General: Bowel sounds are normal. There is no distension.     Palpations: Abdomen is soft.     Tenderness: There is no abdominal tenderness. There is no guarding or rebound.  Lymphadenopathy:     Cervical: No cervical adenopathy.  Skin:    General: Skin is warm and dry.  Neurological:     Mental Status: He is alert.     Comments: EOMI, PERRL, opens and closes eyes adequately, V1 through V3 intact light touch, hearing intact to finger rub, shoulder shrug intact, 5/5 strength in bilateral biceps, triceps, grip, quads, hamstrings, plantar and dorsiflexion, sensation to light touch intact in bilateral UE and LE, normal gait  Psychiatric:        Mood and Affect: Mood normal.     Assessment/Plan:   Problem List Items Addressed This Visit     Elevated blood pressure reading    Blood pressure elevated today.  We will have him return in 2 weeks for recheck.  If still elevated at that time would consider starting on blood pressure medication.      Encounter for general adult medical examination with abnormal findings - Primary    Physical exam completed.  Encouraged healthy diet and exercise.  Discussed adding in 2 to 3 days a week of additional exercise.  Discussed lean meats and plenty of fruits and vegetables.  Discussed reducing soda and sweet tea.  Prostate cancer screening with PSA today.  HIV and hepatitis C screening completed today.  Tetanus vaccine given today.  He was counseled on the Shingrix vaccine and he will check with his  insurance when he gets a new plan in January.  Lab work as outlined.  Blood pressure slightly elevated today.  We will have him return in a couple of weeks for recheck and to discuss the headache.  He was counseled on the definition of a physical exam and what is included in this.  He was advised that he may end up with an additional charge if we discussed things outside of the scope of a physical exam.  Given this he opted to defer any further discussion on his headache until follow-up.      Eustachian tube dysfunction    Suspect eustachian tube dysfunction.  Encouraged to try Allegra or Zyrtec.      Other Visit Diagnoses     Need for tetanus  booster       Relevant Orders   Td : Tetanus/diphtheria >7yo Preservative  free (Completed)   Lipid screening       Relevant Orders   Lipid panel   Comp Met (CMET)   Obesity (BMI 30.0-34.9)       Relevant Orders   HgB A1c   Prostate cancer screening       Relevant Orders   PSA   HIV screening declined       Encounter for screening for HIV       Relevant Orders   HIV antibody (with reflex)   Need for hepatitis C screening test       Relevant Orders   Hepatitis C Antibody       Return in 18 days (on 06/20/2021) for Okay to schedule on the 23rd, follow-up headaches and blood pressure.  This visit occurred during the SARS-CoV-2 public health emergency.  Safety protocols were in place, including screening questions prior to the visit, additional usage of staff PPE, and extensive cleaning of exam room while observing appropriate contact time as indicated for disinfecting solutions.    Tommi Rumps, MD [employer]

## 2021-06-03 LAB — COMPREHENSIVE METABOLIC PANEL
ALT: 29 IU/L (ref 0–44)
AST: 17 IU/L (ref 0–40)
Albumin/Globulin Ratio: 1.9 (ref 1.2–2.2)
Albumin: 4.8 g/dL (ref 3.8–4.9)
Alkaline Phosphatase: 71 IU/L (ref 44–121)
BUN/Creatinine Ratio: 15 (ref 9–20)
BUN: 15 mg/dL (ref 6–24)
Bilirubin Total: 0.5 mg/dL (ref 0.0–1.2)
CO2: 24 mmol/L (ref 20–29)
Calcium: 9.8 mg/dL (ref 8.7–10.2)
Chloride: 102 mmol/L (ref 96–106)
Creatinine, Ser: 1.01 mg/dL (ref 0.76–1.27)
Globulin, Total: 2.5 g/dL (ref 1.5–4.5)
Glucose: 91 mg/dL (ref 70–99)
Potassium: 4.4 mmol/L (ref 3.5–5.2)
Sodium: 141 mmol/L (ref 134–144)
Total Protein: 7.3 g/dL (ref 6.0–8.5)
eGFR: 88 mL/min/{1.73_m2} (ref >59)

## 2021-06-03 LAB — LIPID PANEL
Chol/HDL Ratio: 8.6 ratio — ABNORMAL HIGH (ref 0.0–5.0)
Cholesterol, Total: 268 mg/dL — ABNORMAL HIGH (ref 100–199)
HDL: 31 mg/dL — ABNORMAL LOW (ref >39)
LDL Chol Calc (NIH): 180 mg/dL — ABNORMAL HIGH (ref 0–99)
Triglycerides: 291 mg/dL — ABNORMAL HIGH (ref 0–149)
VLDL Cholesterol Cal: 57 mg/dL — ABNORMAL HIGH (ref 5–40)

## 2021-06-03 LAB — PSA: Prostate Specific Ag, Serum: 1.3 ng/mL (ref 0.0–4.0)

## 2021-06-03 LAB — HEPATITIS C ANTIBODY: Hep C Virus Ab: <0.1 s/co ratio (ref 0.0–0.9)

## 2021-06-03 LAB — HEMOGLOBIN A1C
Est. average glucose Bld gHb Est-mCnc: 128 mg/dL
Hgb A1c MFr Bld: 6.1 % — ABNORMAL HIGH (ref 4.8–5.6)

## 2021-06-03 LAB — HIV ANTIBODY (ROUTINE TESTING W REFLEX): HIV Screen 4th Generation wRfx: NONREACTIVE

## 2021-06-20 ENCOUNTER — Other Ambulatory Visit: Payer: Self-pay

## 2021-06-20 ENCOUNTER — Encounter: Payer: Self-pay | Admitting: Family Medicine

## 2021-06-20 ENCOUNTER — Ambulatory Visit (INDEPENDENT_AMBULATORY_CARE_PROVIDER_SITE_OTHER): Payer: No Typology Code available for payment source | Admitting: Family Medicine

## 2021-06-20 DIAGNOSIS — G44209 Tension-type headache, unspecified, not intractable: Secondary | ICD-10-CM | POA: Diagnosis not present

## 2021-06-20 DIAGNOSIS — R030 Elevated blood-pressure reading, without diagnosis of hypertension: Secondary | ICD-10-CM | POA: Diagnosis not present

## 2021-06-20 DIAGNOSIS — M1990 Unspecified osteoarthritis, unspecified site: Secondary | ICD-10-CM | POA: Diagnosis not present

## 2021-06-20 DIAGNOSIS — E785 Hyperlipidemia, unspecified: Secondary | ICD-10-CM | POA: Diagnosis not present

## 2021-06-20 DIAGNOSIS — E6609 Other obesity due to excess calories: Secondary | ICD-10-CM

## 2021-06-20 DIAGNOSIS — R519 Headache, unspecified: Secondary | ICD-10-CM | POA: Insufficient documentation

## 2021-06-20 DIAGNOSIS — Z6834 Body mass index (BMI) 34.0-34.9, adult: Secondary | ICD-10-CM

## 2021-06-20 MED ORDER — ROSUVASTATIN CALCIUM 40 MG PO TABS: 40.0000 mg | ORAL_TABLET | Freq: Every day | ORAL | 3 refills | Status: DC

## 2021-06-20 NOTE — Progress Notes (Addendum)
Marikay Alar, MD Phone: [phone]  Angel Hudson is a 55 y.o. male who presents today for f/u.  Elevated blood pressure: Patient has not been checking this at home.  No chest pain, shortness with, or edema.  Angel Hudson would prefer to avoid medicine if possible.  Headaches: Angel Hudson has not had any additional headaches.  Chronic joint pain: This is an ongoing issue for years.  Angel Hudson hurts in numerous joints.  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  Angel Hudson has stiffness and some discomfort in his hands.  Angel Hudson also notes knees and back bother him at times.  Angel Hudson has Mobic that Angel Hudson takes occasionally.  Angel Hudson had prednisone for his neck issue and that resolved the neck issue.  Angel Hudson notes no joint swelling.  Obesity: Patient is active at his job but does not do any specific exercise.  Angel Hudson reports his diet is not the best.  Social History   Tobacco Use  Smoking Status Former  Smokeless Tobacco Never    Current Outpatient Medications on File Prior to Visit  Medication Sig Dispense Refill   meloxicam (MOBIC) 15 MG tablet Take 1 tablet (15 mg total) by mouth daily. (Patient not taking: Reported on 06/20/2021) 30 tablet 3   No current facility-administered medications on file prior to visit.     ROS see history of present illness  Objective  Physical Exam Vitals:   06/20/21 0905  BP: 130/80  Pulse: 69  Temp: 98.8 F (37.1 C)  SpO2: 98%    BP Readings from Last 3 Encounters:  06/20/21 130/80  06/02/21 130/90  04/15/21 (!) 132/98   Wt Readings from Last 3 Encounters:  06/20/21 209 lb 3.2 oz (94.9 kg)  06/02/21 209 lb (94.8 kg)  04/15/21 205 lb 12.8 oz (93.4 kg)    Physical Exam Constitutional:      General: Angel Hudson is not in acute distress.    Appearance: Angel Hudson is not diaphoretic.  Cardiovascular:     Rate and Rhythm: Normal rate and regular rhythm.     Heart sounds: Normal heart sounds.  Pulmonary:     Effort: Pulmonary effort is normal.     Breath sounds: Normal breath sounds.   Musculoskeletal:     Comments: Bilateral hands with no joint swelling, tenderness, or erythema  Skin:    General: Skin is warm and dry.  Neurological:     Mental Status: Angel Hudson is alert.     Assessment/Plan: Please see individual problem list.  Problem List Items Addressed This Visit     Arthritis    The patient's joint pain is likely related to osteoarthritis.  Discussed Angel Hudson could use meloxicam sparingly for this.  Advised risk of kidney damage and gastric issues with chronic use.  If Angel Hudson starts to use this on a persistent basis Angel Hudson will let us know so we can monitor his kidney function.      Elevated blood pressure reading    Blood pressure is acceptable today.  Angel Hudson will start checking this at home.  Discussed if it trends up into the 140s over 90s Angel Hudson will let us know.  We will follow-up in 3 months on this.  Advised that diet and exercise should help reduce his blood pressure.      Headache    Resolved.  This could have been related to his blood pressure, tension headache, or related to his neck issue.  Angel Hudson will monitor for recurrence.      Hyperlipidemia    Uncontrolled.  We will start Crestor 40 mg once daily.  Angel Hudson will return in 6 weeks for labs.      Relevant Medications   rosuvastatin (CRESTOR) 40 MG tablet   Other Relevant Orders   Hepatic function panel   LDL cholesterol, direct   Obesity    Encouraged calorie counting.  The patient notes Angel Hudson has access to weight watchers and thinks Angel Hudson will do that.  Encouraged to increase his activity level.  Discussed that reducing his weight would help reduce stress on his joints and likely help with his cholesterol and blood pressure.       Return in about 6 weeks (around 08/01/2021) for Labs, 3 months PCP.  This visit occurred during the SARS-CoV-2 public health emergency.  Safety protocols were in place, including screening questions prior to the visit, additional usage of staff PPE, and extensive cleaning of exam room while observing  appropriate contact time as indicated for disinfecting solutions.    Marikay Alar, MD [facility]

## 2021-06-20 NOTE — Assessment & Plan Note (Signed)
Uncontrolled.  We will start Crestor 40 mg once daily.  He will return in 6 weeks for labs.

## 2021-06-20 NOTE — Assessment & Plan Note (Signed)
Blood pressure is acceptable today.  He will start checking this at home.  Discussed if it trends up into the 140s over 90s he will let us know.  We will follow-up in 3 months on this.  Advised that diet and exercise should help reduce his blood pressure.

## 2021-06-20 NOTE — Assessment & Plan Note (Signed)
The patient's joint pain is likely related to osteoarthritis.  Discussed he could use meloxicam sparingly for this.  Advised risk of kidney damage and gastric issues with chronic use.  If he starts to use this on a persistent basis he will let us know so we can monitor his kidney function.

## 2021-06-20 NOTE — Assessment & Plan Note (Signed)
Encouraged calorie counting.  The patient notes he has access to weight watchers and thinks he will do that.  Encouraged to increase his activity level.  Discussed that reducing his weight would help reduce stress on his joints and likely help with his cholesterol and blood pressure.

## 2021-06-20 NOTE — Patient Instructions (Signed)
Nice to see you. We are going to start you on Crestor 40 mg once daily.  If you develop any muscle aches or joint aches that are increasing please let us know right away.  You will have lab work in 6 weeks. Please buy a blood pressure monitor that goes around your upper arm.  I would like to see her blood pressure 130/80 or less.

## 2021-06-20 NOTE — Assessment & Plan Note (Signed)
Resolved.  This could have been related to his blood pressure, tension headache, or related to his neck issue.  He will monitor for recurrence.

## 2021-08-01 ENCOUNTER — Other Ambulatory Visit: Payer: Self-pay

## 2021-08-01 ENCOUNTER — Other Ambulatory Visit (INDEPENDENT_AMBULATORY_CARE_PROVIDER_SITE_OTHER): Payer: No Typology Code available for payment source

## 2021-08-01 DIAGNOSIS — E785 Hyperlipidemia, unspecified: Secondary | ICD-10-CM

## 2021-08-01 NOTE — Addendum Note (Signed)
Addended by: Warden Fillers on: 08/01/2021 09:22 AM   Modules accepted: Orders

## 2021-08-02 LAB — HEPATIC FUNCTION PANEL
ALT: 29 IU/L (ref 0–44)
AST: 20 IU/L (ref 0–40)
Albumin: 4.6 g/dL (ref 3.8–4.9)
Alkaline Phosphatase: 73 IU/L (ref 44–121)
Bilirubin Total: 0.6 mg/dL (ref 0.0–1.2)
Bilirubin, Direct: 0.16 mg/dL (ref 0.00–0.40)
Total Protein: 6.7 g/dL (ref 6.0–8.5)

## 2021-08-02 LAB — LDL CHOLESTEROL, DIRECT: LDL Direct: 77 mg/dL (ref 0–99)

## 2021-09-19 ENCOUNTER — Ambulatory Visit: Payer: BC Managed Care – PPO | Admitting: Family Medicine

## 2021-09-19 ENCOUNTER — Encounter: Payer: Self-pay | Admitting: Family Medicine

## 2021-09-19 ENCOUNTER — Other Ambulatory Visit: Payer: Self-pay

## 2021-09-19 DIAGNOSIS — E785 Hyperlipidemia, unspecified: Secondary | ICD-10-CM | POA: Diagnosis not present

## 2021-09-19 DIAGNOSIS — R030 Elevated blood-pressure reading, without diagnosis of hypertension: Secondary | ICD-10-CM | POA: Diagnosis not present

## 2021-09-19 DIAGNOSIS — G4733 Obstructive sleep apnea (adult) (pediatric): Secondary | ICD-10-CM

## 2021-09-19 DIAGNOSIS — Z8481 Family history of carrier of genetic disease: Secondary | ICD-10-CM

## 2021-09-19 DIAGNOSIS — T7840XA Allergy, unspecified, initial encounter: Secondary | ICD-10-CM

## 2021-09-19 NOTE — Assessment & Plan Note (Signed)
Refer to genetic counseling. 

## 2021-09-19 NOTE — Patient Instructions (Signed)
Nice to see you.  ?I will get you to see a genetic counselor for the BRCA discussion.  ?

## 2021-09-19 NOTE — Progress Notes (Signed)
?Tommi Rumps, MD ?Phone: [phone] ? ?Angel Hudson is a 56 y.o. male who presents today for f/u. ? ?HYPERLIPIDEMIA ?Symptoms ?Chest pain on exertion:  no   Leg claudication:   no ?Medications: ?Compliance- taking crestor Right upper quadrant pain- no  Muscle aches- no ?Lipid Panel  ?   ?Component Value Date/Time  ? CHOL 268 (H) 06/02/2021 1108  ? TRIG 291 (H) 06/02/2021 1108  ? HDL 31 (L) 06/02/2021 1108  ? CHOLHDL 8.6 (H) 06/02/2021 1108  ? LDLCALC 180 (H) 06/02/2021 1108  ? LDLDIRECT 77 08/01/2021 0923  ? LABVLDL 57 (H) 06/02/2021 1108  ? ?OSA ?CPAP use: not using ?Hypersomnia: no ?Well rested: "not bad" ? ?Elevated blood pressure: Patient brought his blood pressure cuff in today.  It was significantly off compared to ours.  It is much more elevated than our reading in the office.  He notes no chest pain or shortness of breath. ? ?Allergies: Patient notes he seems to be allergic to cats.  He goes over to his mother-in-law's house and his eyes will start to itch in his breathing will change.  He has not been taking any medications for this. ? ?Family history of BRCA 1: Patient never had evaluation for this. ? ?Social History  ? ?Tobacco Use  ?Smoking Status Former  ?Smokeless Tobacco Never  ? ? ?Current Outpatient Medications on File Prior to Visit  ?Medication Sig Dispense Refill  ? rosuvastatin (CRESTOR) 40 MG tablet Take 1 tablet (40 mg total) by mouth daily. 90 tablet 3  ? ?No current facility-administered medications on file prior to visit.  ? ? ? ?ROS see history of present illness ? ?Objective ? ?Physical Exam ?Vitals:  ? 09/19/21 0948  ?BP: 120/80  ?Pulse: 67  ?Temp: 98.5 ?F (36.9 ?C)  ?SpO2: 97%  ? ? ?BP Readings from Last 3 Encounters:  ?09/19/21 120/80  ?06/20/21 130/80  ?06/02/21 130/90  ? ?Wt Readings from Last 3 Encounters:  ?09/19/21 206 lb 9.6 oz (93.7 kg)  ?06/20/21 209 lb 3.2 oz (94.9 kg)  ?06/02/21 209 lb (94.8 kg)  ? ? ?Physical Exam ?Constitutional:   ?   General: He is not in acute  distress. ?   Appearance: He is not diaphoretic.  ?Cardiovascular:  ?   Rate and Rhythm: Normal rate and regular rhythm.  ?   Heart sounds: Normal heart sounds.  ?Pulmonary:  ?   Effort: Pulmonary effort is normal.  ?   Breath sounds: Normal breath sounds.  ?Skin: ?   General: Skin is warm and dry.  ?Neurological:  ?   Mental Status: He is alert.  ? ? ? ?Assessment/Plan: Please see individual problem list. ? ?Problem List Items Addressed This Visit   ? ? Allergies  ?  Patient is likely allergic to cats.  Discussed use of Zyrtec or Allegra. ?  ?  ? Elevated blood pressure reading  ?  Patient's home blood pressure cuff seems to be inaccurate.  His blood pressure is adequately controlled today.  We will continue to monitor.  He will try changing the batteries to see if that makes a difference. ?  ?  ? Family history of BRCA1 gene positive  ?  Refer to genetic counseling. ?  ?  ? Relevant Orders  ? Ambulatory referral to Genetics  ? Hyperlipidemia  ?  Continue Crestor 40 mg once daily.  Most recent LDL is adequately controlled. ?  ?  ? OSA (obstructive sleep apnea)  ?  I encouraged him  to start using his CPAP.  I discussed the risk of not using it including cardiovascular disease and death. ?  ?  ? ? ?Return in about 6 months (around 03/22/2022). ? ?This visit occurred during the SARS-CoV-2 public health emergency.  Safety protocols were in place, including screening questions prior to the visit, additional usage of staff PPE, and extensive cleaning of exam room while observing appropriate contact time as indicated for disinfecting solutions.  ? ? ?Tommi Rumps, MD ?[place] ? ?

## 2021-09-19 NOTE — Assessment & Plan Note (Signed)
Patient's home blood pressure cuff seems to be inaccurate.  His blood pressure is adequately controlled today.  We will continue to monitor.  He will try changing the batteries to see if that makes a difference. ?

## 2021-09-19 NOTE — Assessment & Plan Note (Signed)
Patient is likely allergic to cats.  Discussed use of Zyrtec or Allegra. ?

## 2021-09-19 NOTE — Assessment & Plan Note (Signed)
Continue Crestor 40 mg once daily.  Most recent LDL is adequately controlled. ?

## 2021-09-19 NOTE — Assessment & Plan Note (Signed)
I encouraged him to start using his CPAP.  I discussed the risk of not using it including cardiovascular disease and death. ?

## 2021-10-14 ENCOUNTER — Inpatient Hospital Stay: Payer: BC Managed Care – PPO | Attending: Oncology | Admitting: Licensed Clinical Social Worker

## 2021-10-14 ENCOUNTER — Encounter (INDEPENDENT_AMBULATORY_CARE_PROVIDER_SITE_OTHER): Payer: Self-pay

## 2021-10-14 ENCOUNTER — Encounter: Payer: Self-pay | Admitting: Licensed Clinical Social Worker

## 2021-10-14 ENCOUNTER — Inpatient Hospital Stay: Payer: BC Managed Care – PPO

## 2021-10-14 DIAGNOSIS — Z803 Family history of malignant neoplasm of breast: Secondary | ICD-10-CM | POA: Diagnosis not present

## 2021-10-14 DIAGNOSIS — Z8481 Family history of carrier of genetic disease: Secondary | ICD-10-CM | POA: Diagnosis not present

## 2021-10-14 DIAGNOSIS — Z800 Family history of malignant neoplasm of digestive organs: Secondary | ICD-10-CM | POA: Diagnosis not present

## 2021-10-14 DIAGNOSIS — Z8041 Family history of malignant neoplasm of ovary: Secondary | ICD-10-CM

## 2021-10-14 NOTE — Progress Notes (Signed)
REFERRING PROVIDER: ?Leone Haven, MD ?[address] ?[address] ?[place],  [place] [address] ? ?PRIMARY PROVIDER:  ?Leone Haven, MD ? ?PRIMARY REASON FOR VISIT:  ?1. Family history of BRCA1 gene positive   ?2. Family history of breast cancer   ?3. Family history of ovarian cancer   ?4. Family history of pancreatic cancer   ? ? ? ?HISTORY OF PRESENT ILLNESS:   ?Mr. Fortson, a 56 y.o. male, was seen for a [facility] cancer genetics consultation at the request of Dr. Caryl Bis due to a family history of BRCA1 mutation Mr. Bonnin presents to clinic today to discuss the possibility of a hereditary predisposition to cancer, genetic testing, and to further clarify his future cancer risks, as well as potential cancer risks for family members.  ? ?Mr. Stief is a 56 y.o. male with no personal history of cancer.  He reports having a colonoscopy that was normal and is unsure if he has had prostate cancer screening.  ? ?CANCER HISTORY:  ?Oncology History  ? No history exists.  ? ? ?Past Medical History:  ?Diagnosis Date  ? Allergic rhinitis   ? Chickenpox   ? Family history of BRCA1 gene positive   ? Family history of breast cancer   ? Family history of ovarian cancer   ? Family history of pancreatic cancer   ? ? ?Past Surgical History:  ?Procedure Laterality Date  ? COLONOSCOPY WITH PROPOFOL N/A 09/13/2017  ? Procedure: COLONOSCOPY WITH PROPOFOL;  Surgeon: Jonathon Bellows, MD;  Location: [facility];  Service: Gastroenterology;  Laterality: N/A;  ? ? ?Social History  ? ?Socioeconomic History  ? Marital status: Married  ?  Spouse name: Not on file  ? Number of children: Not on file  ? Years of education: Not on file  ? Highest education level: Not on file  ?Occupational History  ? Not on file  ?Tobacco Use  ? Smoking status: Former  ? Smokeless tobacco: Never  ?Substance and Sexual Activity  ? Alcohol use: Yes  ?  Alcohol/week: 0.0 standard drinks  ?  Comment: Rarely  ? Drug use: No  ? Sexual activity: Not on file   ?Other Topics Concern  ? Not on file  ?Social History Narrative  ? Not on file  ? ?Social Determinants of Health  ? ?Financial Resource Strain: Not on file  ?Food Insecurity: Not on file  ?Transportation Needs: Not on file  ?Physical Activity: Not on file  ?Stress: Not on file  ?Social Connections: Not on file  ?  ? ?FAMILY HISTORY:  ?We obtained a detailed, 4-generation family history.  Significant diagnoses are listed below: ?Family History  ?Problem Relation Age of Onset  ? Alcoholism Unknown   ? Heart disease Unknown   ? Stroke Unknown   ? Diabetes Unknown   ? ?Mr. Styer has 1 son, Tommi Rumps, age 34. He has a Conservator, museum/gallery. He has 1 sister, 85, who tested positive for a BRCA1 mutation called 65insC in 2010. She has since had bilateral mastectomy and BSO. Patient also has 1 brother, 9, and is unsure if he has had genetic testing. ? ?Mr. Randon's mother had breast cancer at 1 with a recurrence of this later in life, she passed at 37. She also was BRCA1 positive. Patient had 2 maternal uncles and 1 aunt. His aunt had pancreatic cancer and died at 66 and reportedly had negative genetic testing. Maternal grandmother had ovarian cancer at 11 and died at 54. She had 5 sisters, all  had cancer. Two had breast cancer, 1 had ovarian, breast and colon, one had lung cancer and 1 had an unknown cancer. Patient also has 3 first cousins once removed who had breast cancer on that side and 1 first cousin once removed who had ovarian. Maternal grandfather passed at 69. ? ?Ms. Riesgo is unaware of cancer on his paternal side of the family. ? ?Mr. Uphoff is aware of previous family history of genetic testing for hereditary cancer risks. Patient's maternal ancestors are of [place] descent, and paternal ancestors are of English descent. There is no reported Ashkenazi Jewish ancestry. There is no known consanguinity. ? ? ? ?GENETIC COUNSELING ASSESSMENT: Mr. Credit is a 56 y.o. male with a family history of a BRCA1 mutation.  We,  therefore, discussed and recommended the following at today's visit.  ? ?DISCUSSION: We discussed the BRCA1 gene in detail, noting cancer risks associated and potential management changes. The BRCA1 gene increases risk for male and male breast cancer, ovarian cancer, prostate cancer, male breast cancer, and pancreatic cancer. We discussed that testing is beneficial for several reasons including knowing about cancer risks, identifying potential screening and risk-reduction options that may be appropriate, and to understand if other family members could be at risk for cancer and allow them to undergo genetic testing.  ? ?We reviewed the characteristics, features and inheritance patterns of hereditary cancer syndromes. We also discussed genetic testing, including the appropriate family members to test, the process of testing, insurance coverage and turn-around-time for results. We discussed the implications of a negative, positive and/or variant of uncertain significant result. We recommended Mr. Muzquiz pursue genetic testing for the known familial pathogenic variant in BRCA1 called 5385insC (patient brought copy of family reports).  ? ?Based on Mr. Hubert family history of cancer, he meets medical criteria for genetic testing. Despite that he meets criteria, he may still have an out of pocket cost. We discussed that if his out of pocket cost for testing is over $100, the laboratory will call and confirm whether he wants to proceed with testing.  If the out of pocket cost of testing is less than $100 he will be billed by the genetic testing laboratory.  ? ?PLAN: After considering the risks, benefits, and limitations, Mr. Marucci provided informed consent to pursue genetic testing and the blood sample was sent to [facility] for analysis of the BRCA1 family variant. Results should be available within approximately 2-3 weeks' time, at which point they will be disclosed by telephone to Mr. Hirsch, as will  any additional recommendations warranted by these results. Mr. Jamar will receive a summary of his genetic counseling visit and a copy of his results once available. This information will also be available in Epic.  ? ?Mr. Holton's questions were answered to his satisfaction today. Our contact information was provided should additional questions or concerns arise. Thank you for the referral and allowing [place] to share in the care of your patient.  ? ?Faith Rogue, MS, LCGC ?Genetic Counselor ?[email or web address] ?Phone: [phone] ? ?The patient was seen for a total of 25 minutes in face-to-face genetic counseling.  Dr. Grayland Ormond was available for discussion regarding this case. Pharmacy student Judeen Hammans was also present and observed.  ? ?_______________________________________________________________________ ?For Office Staff:  ?Number of people involved in session: 2 ?Was an Intern/ student involved with case: yes ? ?

## 2021-11-04 ENCOUNTER — Encounter: Payer: Self-pay | Admitting: Family Medicine

## 2021-11-04 ENCOUNTER — Telehealth: Payer: Self-pay | Admitting: Licensed Clinical Social Worker

## 2021-11-18 ENCOUNTER — Ambulatory Visit: Payer: Self-pay | Admitting: Licensed Clinical Social Worker

## 2021-11-18 ENCOUNTER — Encounter: Payer: Self-pay | Admitting: Licensed Clinical Social Worker

## 2021-11-18 DIAGNOSIS — Z1379 Encounter for other screening for genetic and chromosomal anomalies: Secondary | ICD-10-CM | POA: Insufficient documentation

## 2021-11-18 NOTE — Progress Notes (Signed)
HPI:  [name] was previously seen in the [facility] clinic due to a family history of a BRCA1 mutation and concerns regarding a hereditary predisposition to cancer. Please refer to our prior cancer genetics clinic note for more information regarding our discussion, assessment and recommendations, at the time. [name] recent genetic test results were disclosed to him, as were recommendations warranted by these results. These results and recommendations are discussed in more detail below.  CANCER HISTORY:  Oncology History   No history exists.    FAMILY HISTORY:  We obtained a detailed, 4-generation family history.  Significant diagnoses are listed below: Family History  Problem Relation Age of Onset   Alcoholism Unknown    Heart disease Unknown    Stroke Unknown    Diabetes Unknown    [name] has 1 son, Angel Hudson, age 1. He has a Conservator, museum/gallery. He has 1 sister, 36, who tested positive for a BRCA1 mutation called 23insC in 2010. She has since had bilateral mastectomy and BSO. Patient also has 1 brother, 43, and is unsure if he has had genetic testing.   [name] mother had breast cancer at 80 with a recurrence of this later in life, she passed at 40. She also was BRCA1 positive. Patient had 2 maternal uncles and 1 aunt. His aunt had pancreatic cancer and died at 36 and reportedly had negative genetic testing. Maternal grandmother had ovarian cancer at 66 and died at 37. She had 5 sisters, all had cancer. Two had breast cancer, 1 had ovarian, breast and colon, one had lung cancer and 1 had an unknown cancer. Patient also has 3 first cousins once removed who had breast cancer on that side and 1 first cousin once removed who had ovarian. Maternal grandfather passed at 66.   Ms. Angel Hudson is unaware of cancer on his paternal side of the family.   [name] is aware of previous family history of genetic testing for hereditary cancer risks. Patient's maternal ancestors  are of [place] descent, and paternal ancestors are of English descent. There is no reported Ashkenazi Jewish ancestry. There is no known consanguinity.       GENETIC TEST RESULTS: Genetic testing reported out on 10/31/2021 through [place] family variant testing did not identify the known familial variant in BRCA1.   The test report has been scanned into EPIC and is located under the Molecular Pathology section of the Results Review tab.  A portion of the result report is included below for reference.       We recommended [name] pursue testing for the familial hereditary cancer gene mutation called BRCA1 [id number]. [name] test was normal and did not reveal the familial mutation. We call this result a true negative result because the cancer-causing mutation was identified in [name] family, and he did not inherit it.  Given this negative result, [name] chances of developing BRCA1-related cancers are the same as they are in the general population.    ADDITIONAL GENETIC TESTING: We discussed with [name] that there are other genes that are associated with increased cancer risk that can be analyzed. We are happy to discuss and coordinate this testing at any time.    CANCER SCREENING RECOMMENDATIONS: [name] test result is considered negative (normal).    While reassuring, this does not definitively rule out a hereditary predisposition to cancer. It is still possible that there could be genetic mutations that are undetectable by current technology. There could be genetic  mutations in genes that have not been tested or identified to increase cancer risk.  Therefore, it is recommended he continue to follow the cancer management and screening guidelines provided by his  primary healthcare provider.   An individual's cancer risk and medical management are not determined by genetic test results alone. Overall cancer risk assessment incorporates additional factors, including  personal medical history, family history, and any available genetic information that may result in a personalized plan for cancer prevention and surveillance.  RECOMMENDATIONS FOR FAMILY MEMBERS:  Relatives in this family might be at some increased risk of developing cancer, over the general population risk, simply due to the family history of cancer.  We recommended male relatives in this family have a yearly mammogram beginning at age 7, or 73 years younger than the earliest onset of cancer, an annual clinical breast exam, and perform monthly breast self-exams. Male relatives in this family should also have a gynecological exam as recommended by their primary provider.  All family members should be referred for colonoscopy starting at age 21.   Based on [name] family history, we recommended his maternal relatives have genetic counseling and testing for the known BRCA1 mutation. [name] will let us know if we can be of any assistance in coordinating genetic counseling and/or testing for these family members.  FOLLOW-UP: Lastly, we discussed with [name] that cancer genetics is a rapidly advancing field and it is possible that new genetic tests will be appropriate for him and/or his family members in the future. We encouraged him to remain in contact with cancer genetics on an annual basis so we can update his personal and family histories and let him know of advances in cancer genetics that may benefit this family.   Our contact number was provided. [name] questions were answered to his satisfaction, and he knows he is welcome to call us at anytime with additional questions or concerns.   Faith Rogue, MS, [facility] Genetic Counselor [email or web address] Phone: [phone]

## 2021-11-18 NOTE — Telephone Encounter (Signed)
Revealed negative genetic testing.  This normal result is reassuring.  It is unlikely that there is an increased risk of  cancer due to a mutation in one of these genes.  However, genetic testing is not perfect, and cannot definitively rule out a hereditary cause.  It will be important for him to keep in contact with genetics to learn if any additional testing may be needed in the future.      

## 2022-03-27 ENCOUNTER — Ambulatory Visit: Payer: BC Managed Care – PPO | Admitting: Family Medicine

## 2022-04-10 ENCOUNTER — Ambulatory Visit: Payer: BC Managed Care – PPO | Admitting: Family Medicine

## 2022-04-10 ENCOUNTER — Encounter: Payer: Self-pay | Admitting: Family Medicine

## 2022-04-10 VITALS — BP 120/78 | Temp 98.2°F | Ht 65.0 in | Wt 210.2 lb

## 2022-04-10 DIAGNOSIS — G4733 Obstructive sleep apnea (adult) (pediatric): Secondary | ICD-10-CM | POA: Diagnosis not present

## 2022-04-10 DIAGNOSIS — K219 Gastro-esophageal reflux disease without esophagitis: Secondary | ICD-10-CM | POA: Diagnosis not present

## 2022-04-10 DIAGNOSIS — E785 Hyperlipidemia, unspecified: Secondary | ICD-10-CM

## 2022-04-10 DIAGNOSIS — Z8481 Family history of carrier of genetic disease: Secondary | ICD-10-CM | POA: Diagnosis not present

## 2022-04-10 NOTE — Assessment & Plan Note (Signed)
Rarely has symptoms.  Discussed use of Tums as needed.  Discussed if he starts having symptoms multiple times a week we could consider a daily medication.

## 2022-04-10 NOTE — Patient Instructions (Signed)
Nice to see you. We will contact you with your lab results. 

## 2022-04-10 NOTE — Assessment & Plan Note (Signed)
I encouraged him to start using his CPAP.  I discussed the risk of cardiovascular disease and death with untreated sleep apnea.

## 2022-04-10 NOTE — Assessment & Plan Note (Signed)
Patient was negative for BRCA1 mutation through genetic screening.

## 2022-04-10 NOTE — Assessment & Plan Note (Signed)
Continue Crestor 40 mg daily.  Check labs today.

## 2022-04-10 NOTE — Addendum Note (Signed)
Addended by: Neta Ehlers on: 04/10/2022 10:46 AM   Modules accepted: Orders

## 2022-04-10 NOTE — Progress Notes (Signed)
  Tommi Rumps, MD Phone: [phone]  Angel Hudson is a 56 y.o. male who presents today for f/u.  HYPERLIPIDEMIA Symptoms Chest pain on exertion:  no   Leg claudication:   no Medications: Compliance- taking crestor Right upper quadrant pain- no  Muscle aches- no Lipid Panel     Component Value Date/Time   CHOL 268 (H) 06/02/2021 1108   TRIG 291 (H) 06/02/2021 1108   HDL 31 (L) 06/02/2021 1108   CHOLHDL 8.6 (H) 06/02/2021 1108   LDLCALC 180 (H) 06/02/2021 1108   LDLDIRECT 77 08/01/2021 0923   LABVLDL 57 (H) 06/02/2021 1108   OSA: Patient has not been using his CPAP.  He notes no hypersomnia.  He generally wakes well rested.  GERD: Patient notes this rarely occurs.  Does not even occur weekly.  No blood in his stool.  No dysphagia.  Social History   Tobacco Use  Smoking Status Former  Smokeless Tobacco Never    Current Outpatient Medications on File Prior to Visit  Medication Sig Dispense Refill   rosuvastatin (CRESTOR) 40 MG tablet Take 1 tablet (40 mg total) by mouth daily. 90 tablet 3   No current facility-administered medications on file prior to visit.     ROS see history of present illness  Objective  Physical Exam Vitals:   04/10/22 1030  BP: 120/78  Temp: 98.2 F (36.8 C)  SpO2: 97%    BP Readings from Last 3 Encounters:  04/10/22 120/78  09/19/21 120/80  06/20/21 130/80   Wt Readings from Last 3 Encounters:  04/10/22 210 lb 3.2 oz (95.3 kg)  09/19/21 206 lb 9.6 oz (93.7 kg)  06/20/21 209 lb 3.2 oz (94.9 kg)    Physical Exam Constitutional:      General: He is not in acute distress.    Appearance: He is not diaphoretic.  Cardiovascular:     Rate and Rhythm: Normal rate and regular rhythm.     Heart sounds: Normal heart sounds.  Pulmonary:     Effort: Pulmonary effort is normal.     Breath sounds: Normal breath sounds.  Skin:    General: Skin is warm and dry.  Neurological:     Mental Status: He is alert.       Assessment/Plan: Please see individual problem list.  Problem List Items Addressed This Visit     Hyperlipidemia (Chronic)    Continue Crestor 40 mg daily.  Check labs today.      Relevant Orders   Comp Met (CMET)   Lipid panel   OSA (obstructive sleep apnea) - Primary (Chronic)    I encouraged him to start using his CPAP.  I discussed the risk of cardiovascular disease and death with untreated sleep apnea.      Family history of BRCA1 gene positive    Patient was negative for BRCA1 mutation through genetic screening.      GERD (gastroesophageal reflux disease)    Rarely has symptoms.  Discussed use of Tums as needed.  Discussed if he starts having symptoms multiple times a week we could consider a daily medication.       Return in about 1 year (around 04/11/2023) for CPE.   Tommi Rumps, MD [place]

## 2022-04-11 LAB — COMPREHENSIVE METABOLIC PANEL
ALT: 28 IU/L (ref 0–44)
AST: 21 IU/L (ref 0–40)
Albumin/Globulin Ratio: 2 (ref 1.2–2.2)
Albumin: 4.4 g/dL (ref 3.8–4.9)
Alkaline Phosphatase: 64 IU/L (ref 44–121)
BUN/Creatinine Ratio: 13 (ref 9–20)
BUN: 12 mg/dL (ref 6–24)
Bilirubin Total: 0.4 mg/dL (ref 0.0–1.2)
CO2: 25 mmol/L (ref 20–29)
Calcium: 9.4 mg/dL (ref 8.7–10.2)
Chloride: 104 mmol/L (ref 96–106)
Creatinine, Ser: 0.94 mg/dL (ref 0.76–1.27)
Globulin, Total: 2.2 g/dL (ref 1.5–4.5)
Glucose: 93 mg/dL (ref 70–99)
Potassium: 4.5 mmol/L (ref 3.5–5.2)
Sodium: 141 mmol/L (ref 134–144)
Total Protein: 6.6 g/dL (ref 6.0–8.5)
eGFR: 96 mL/min/{1.73_m2} (ref >59)

## 2022-04-11 LAB — LIPID PANEL
Chol/HDL Ratio: 3.9 ratio (ref 0.0–5.0)
Cholesterol, Total: 109 mg/dL (ref 100–199)
HDL: 28 mg/dL — ABNORMAL LOW (ref >39)
LDL Chol Calc (NIH): 59 mg/dL (ref 0–99)
Triglycerides: 118 mg/dL (ref 0–149)
VLDL Cholesterol Cal: 22 mg/dL (ref 5–40)

## 2022-08-23 ENCOUNTER — Other Ambulatory Visit: Payer: Self-pay | Admitting: Family Medicine

## 2022-08-23 DIAGNOSIS — E785 Hyperlipidemia, unspecified: Secondary | ICD-10-CM

## 2022-11-04 ENCOUNTER — Encounter: Payer: Self-pay | Admitting: Family Medicine

## 2022-11-04 ENCOUNTER — Ambulatory Visit: Payer: BC Managed Care – PPO | Admitting: Family Medicine

## 2022-11-04 VITALS — BP 126/82 | HR 96 | Temp 98.3°F | Ht 65.0 in | Wt 200.2 lb

## 2022-11-04 DIAGNOSIS — R5383 Other fatigue: Secondary | ICD-10-CM

## 2022-11-04 DIAGNOSIS — W57XXXA Bitten or stung by nonvenomous insect and other nonvenomous arthropods, initial encounter: Secondary | ICD-10-CM

## 2022-11-04 DIAGNOSIS — S70369A Insect bite (nonvenomous), unspecified thigh, initial encounter: Secondary | ICD-10-CM | POA: Insufficient documentation

## 2022-11-04 DIAGNOSIS — S70362A Insect bite (nonvenomous), left thigh, initial encounter: Secondary | ICD-10-CM | POA: Diagnosis not present

## 2022-11-04 MED ORDER — DOXYCYCLINE HYCLATE 100 MG PO TABS: 100.0000 mg | ORAL_TABLET | Freq: Two times a day (BID) | ORAL | 0 refills | Status: DC

## 2022-11-04 NOTE — Assessment & Plan Note (Signed)
Has been going on for some time.  Possibly worsened with recent tick bite.  Will check lab work as outlined to evaluate for underlying cause.

## 2022-11-04 NOTE — Assessment & Plan Note (Addendum)
Concerning for local infection at the site of tick bite versus more systemic illness with sweats.  Will start on doxycycline 100 mg twice daily.  We will order [facility] spotted fever testing as well as Lyme testing.  Advised to seek medical attention for any persistent fevers or any worsening symptoms.  Discussed risk of diarrhea and skin sensitivity to the sun with the doxycycline.

## 2022-11-04 NOTE — Patient Instructions (Signed)
Nice to see you. Please start the antibiotic.  If you have excessive diarrhea on this please let us know. If you develop any worsening symptoms please get reevaluated.

## 2022-11-04 NOTE — Progress Notes (Signed)
  Marikay Alar, MD Phone: [phone]  Angel Hudson is a 57 y.o. male who presents today for same day visit.   Tick bite: Patient notes he thinks the tick bit him on 10/31/2022.  He did not get it removed until 11/03/2022.  It was not engorged.  He has had a rash and some erythema in the area of the tick bite.  He notes no fevers though has been having some sweats.  He did note swollen lymph nodes in his left groin and some erythema in his left groin that seems to have improved some.  He also reports some fatigue that has worsened recently though had been going on prior to the tick bite.  Social History   Tobacco Use  Smoking Status Former  Smokeless Tobacco Never    Current Outpatient Medications on File Prior to Visit  Medication Sig Dispense Refill   rosuvastatin (CRESTOR) 40 MG tablet TAKE 1 TABLET BY MOUTH EVERY DAY (Patient not taking: Reported on 11/04/2022) 90 tablet 3   No current facility-administered medications on file prior to visit.     ROS see history of present illness  Objective  Physical Exam Vitals:   11/04/22 1108 11/04/22 1121  BP: 138/84 126/82  Pulse: 96   Temp: 98.3 F (36.8 C)   SpO2: 97%     BP Readings from Last 3 Encounters:  11/04/22 126/82  04/10/22 120/78  09/19/21 120/80   Wt Readings from Last 3 Encounters:  11/04/22 200 lb 3.2 oz (90.8 kg)  04/10/22 210 lb 3.2 oz (95.3 kg)  09/19/21 206 lb 9.6 oz (93.7 kg)    Physical Exam Constitutional:      General: He is not in acute distress.    Appearance: He is not diaphoretic.  Cardiovascular:     Rate and Rhythm: Normal rate and regular rhythm.     Heart sounds: Normal heart sounds.  Pulmonary:     Effort: Pulmonary effort is normal.  Skin:    General: Skin is warm and dry.       Neurological:     Mental Status: He is alert.      Assessment/Plan: Please see individual problem list.  Tick bite of left thigh, initial encounter Assessment & Plan: Concerning for local  infection at the site of tick bite versus more systemic illness with sweats.  Will start on doxycycline 100 mg twice daily.  We will order [facility] spotted fever testing as well as Lyme testing.  Advised to seek medical attention for any persistent fevers or any worsening symptoms.  Discussed risk of diarrhea and skin sensitivity to the sun with the doxycycline.  Orders: -     Spotted Fever Group Antibodies -     Lyme Disease Serology w/Reflex -     Doxycycline Hyclate; Take 1 tablet (100 mg total) by mouth 2 (two) times daily.  Dispense: 28 tablet; Refill: 0  Other fatigue Assessment & Plan: Has been going on for some time.  Possibly worsened with recent tick bite.  Will check lab work as outlined to evaluate for underlying cause.  Orders: -     Comprehensive metabolic panel -     CBC with Differential/Platelet -     TSH -     VITAMIN D 25 Hydroxy (Vit-D Deficiency, Fractures) -     Vitamin B12     Return if symptoms worsen or fail to improve.   Marikay Alar, MD [facility]

## 2022-11-05 ENCOUNTER — Telehealth: Payer: Self-pay

## 2022-11-05 NOTE — Telephone Encounter (Signed)
-----   Message from Glori Luis, MD sent at 11/05/2022 11:22 AM EDT ----- Please let the patient know that his vitamin D is mildly low. This could cause some fatigue. He should start on vitamin D 1000 IU over the counter once daily and have this rechecked in 2 months. I am still waiting on the tick borne illness labs that we got and we will contact him once those return.

## 2022-11-05 NOTE — Telephone Encounter (Signed)
Left message to call the office back.

## 2022-11-05 NOTE — Telephone Encounter (Signed)
Patient called and note was read. 

## 2022-11-06 ENCOUNTER — Telehealth: Payer: Self-pay

## 2022-11-06 NOTE — Telephone Encounter (Signed)
LVMTCB in regards to labs

## 2022-11-10 ENCOUNTER — Telehealth: Payer: Self-pay

## 2022-11-10 LAB — CBC WITH DIFFERENTIAL/PLATELET
Basophils Absolute: 0.1 10*3/uL (ref 0.0–0.2)
Basos: 1 %
EOS (ABSOLUTE): 0.2 10*3/uL (ref 0.0–0.4)
Eos: 3 %
Hematocrit: 46.6 % (ref 37.5–51.0)
Hemoglobin: 15 g/dL (ref 13.0–17.7)
Immature Grans (Abs): 0 10*3/uL (ref 0.0–0.1)
Immature Granulocytes: 0 %
Lymphocytes Absolute: 2.5 10*3/uL (ref 0.7–3.1)
Lymphs: 32 %
MCH: 30.4 pg (ref 26.6–33.0)
MCHC: 32.2 g/dL (ref 31.5–35.7)
MCV: 94 fL (ref 79–97)
Monocytes Absolute: 0.5 10*3/uL (ref 0.1–0.9)
Monocytes: 6 %
Neutrophils Absolute: 4.5 10*3/uL (ref 1.4–7.0)
Neutrophils: 58 %
Platelets: 249 10*3/uL (ref 150–450)
RBC: 4.94 x10E6/uL (ref 4.14–5.80)
RDW: 13.3 % (ref 11.6–15.4)
WBC: 7.9 10*3/uL (ref 3.4–10.8)

## 2022-11-10 LAB — COMPREHENSIVE METABOLIC PANEL
ALT: 34 IU/L (ref 0–44)
AST: 23 IU/L (ref 0–40)
Albumin/Globulin Ratio: 1.6 (ref 1.2–2.2)
Albumin: 4.4 g/dL (ref 3.8–4.9)
Alkaline Phosphatase: 75 IU/L (ref 44–121)
BUN/Creatinine Ratio: 16 (ref 9–20)
BUN: 15 mg/dL (ref 6–24)
Bilirubin Total: 0.3 mg/dL (ref 0.0–1.2)
CO2: 23 mmol/L (ref 20–29)
Calcium: 10 mg/dL (ref 8.7–10.2)
Chloride: 101 mmol/L (ref 96–106)
Creatinine, Ser: 0.92 mg/dL (ref 0.76–1.27)
Globulin, Total: 2.8 g/dL (ref 1.5–4.5)
Glucose: 104 mg/dL — ABNORMAL HIGH (ref 70–99)
Potassium: 3.7 mmol/L (ref 3.5–5.2)
Sodium: 140 mmol/L (ref 134–144)
Total Protein: 7.2 g/dL (ref 6.0–8.5)
eGFR: 98 mL/min/{1.73_m2} (ref >59)

## 2022-11-10 LAB — LYME DISEASE SEROLOGY W/REFLEX: Lyme Total Antibody EIA: NEGATIVE

## 2022-11-10 LAB — SPOTTED FEVER GROUP ANTIBODIES
Spotted Fever Group IgG: <1:64 {titer}
Spotted Fever Group IgM: <1:64 {titer}

## 2022-11-10 LAB — VITAMIN D 25 HYDROXY (VIT D DEFICIENCY, FRACTURES): Vit D, 25-Hydroxy: 26 ng/mL — ABNORMAL LOW (ref 30.0–100.0)

## 2022-11-10 LAB — VITAMIN B12: Vitamin B-12: 425 pg/mL (ref 232–1245)

## 2022-11-10 LAB — TSH: TSH: 1.75 u[IU]/mL (ref 0.450–4.500)

## 2022-11-10 NOTE — Telephone Encounter (Signed)
-----   Message from Eric G Sonnenberg, MD sent at 11/05/2022 11:22 AM EDT ----- Please let the patient know that his vitamin D is mildly low. This could cause some fatigue. He should start on vitamin D 1000 IU over the counter once daily and have this rechecked in 2 months. I am still waiting on the tick borne illness labs that we got and we will contact him once those return.  

## 2022-11-10 NOTE — Telephone Encounter (Signed)
Pt called back and I read the message to him and he stated he received the information already

## 2022-11-10 NOTE — Telephone Encounter (Signed)
Left message to call the office back regarding 2 separate result notes

## 2022-11-10 NOTE — Telephone Encounter (Signed)
Already spoke to the Patient and he states the redness is gone

## 2022-11-11 NOTE — Telephone Encounter (Signed)
Noted. Please make sure he is scheduled to have his vitamin D rechecked in 2 months. Thanks.

## 2022-11-12 ENCOUNTER — Other Ambulatory Visit: Payer: Self-pay

## 2022-11-12 DIAGNOSIS — R7989 Other specified abnormal findings of blood chemistry: Secondary | ICD-10-CM

## 2022-11-12 NOTE — Telephone Encounter (Signed)
Left message to call and schedule a vitamin D recheck around July 12-18 and the vitamin D lab has been ordered

## 2022-11-13 NOTE — Telephone Encounter (Signed)
LMTCB

## 2022-11-13 NOTE — Telephone Encounter (Signed)
Pt called back and I made the lab appointment for July 15

## 2022-11-23 IMAGING — CR DG CERVICAL SPINE COMPLETE 4+V
1 series · 7 of 7 positions shown · non-contrast
Comparison: None.

CLINICAL DATA: Neck pain

EXAM:
CERVICAL SPINE - COMPLETE 4+ VIEW

[Series 1: dg cervical spine complete · 0.14mm/px · 7 of 7 slices shown]
[im 1/7]
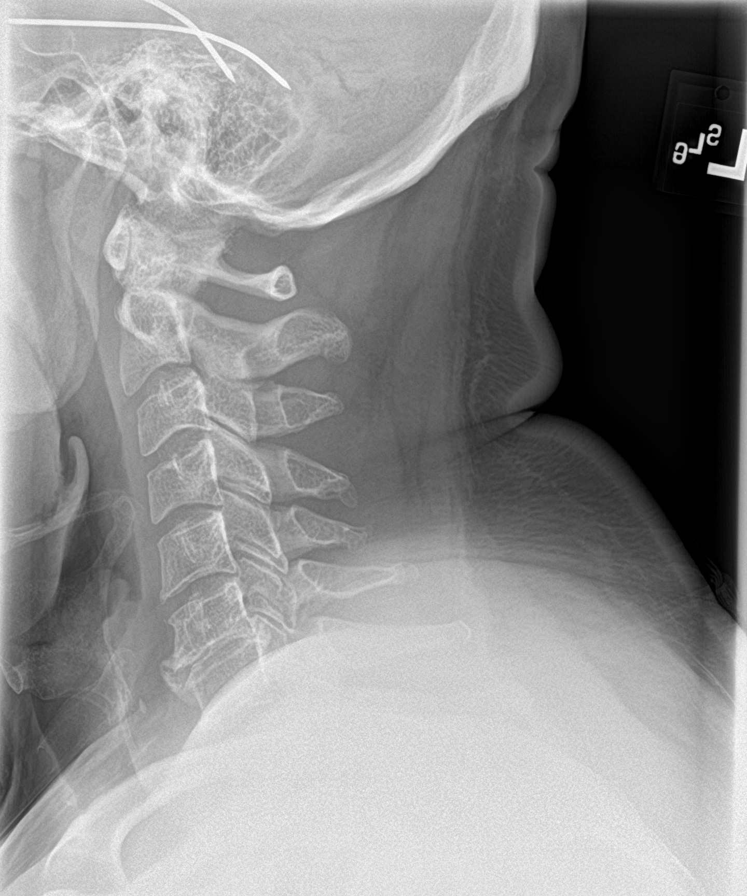
[im 2/7]
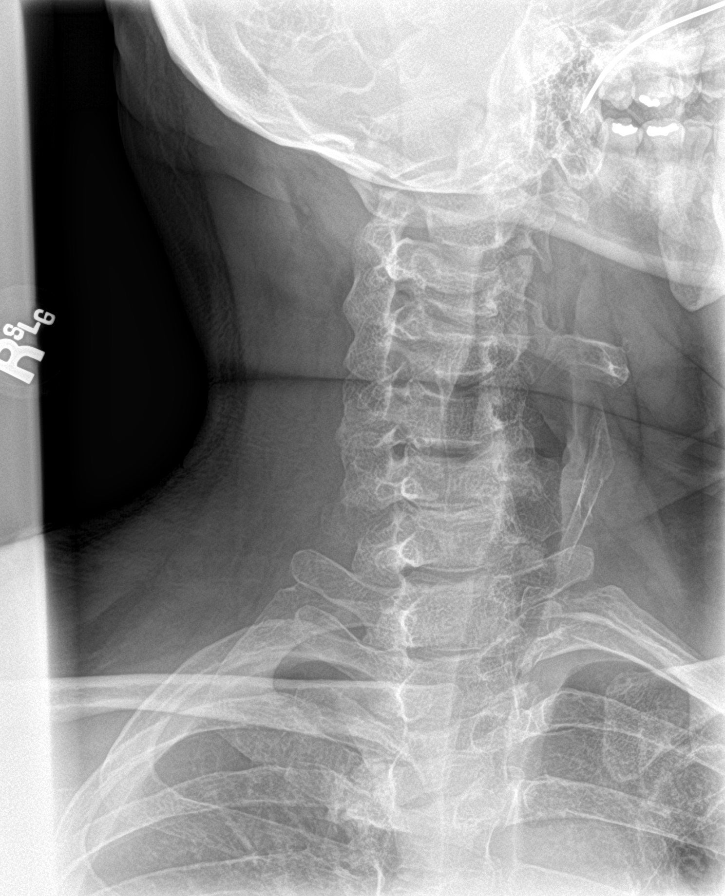
[im 3/7]
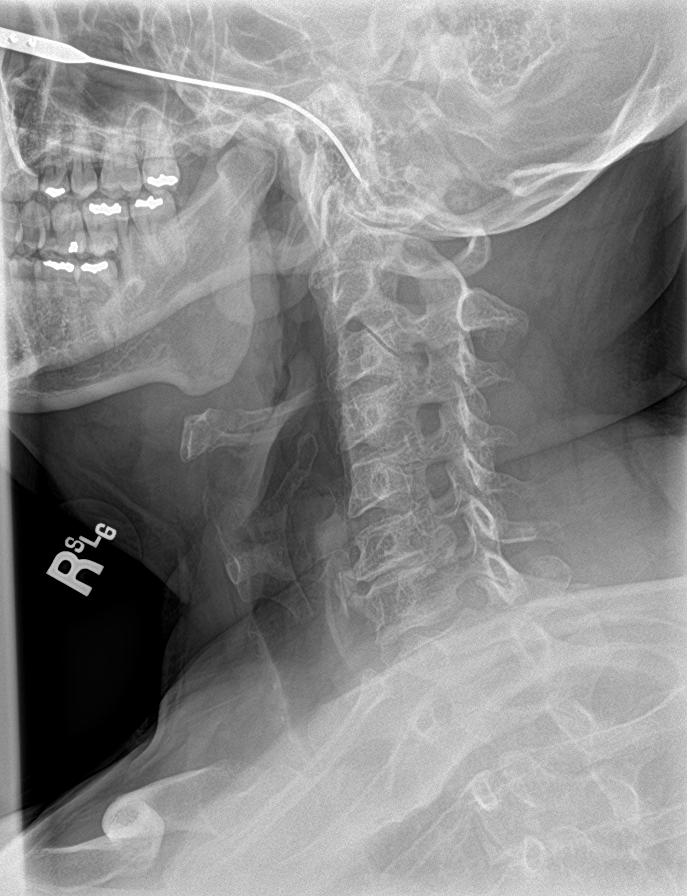
[im 4/7]
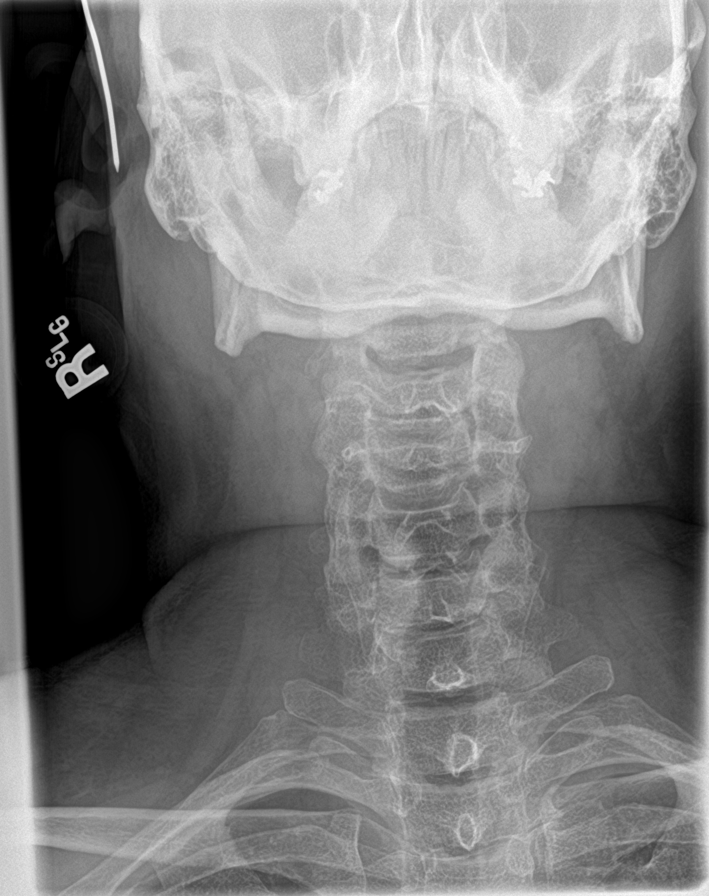
[im 5/7]
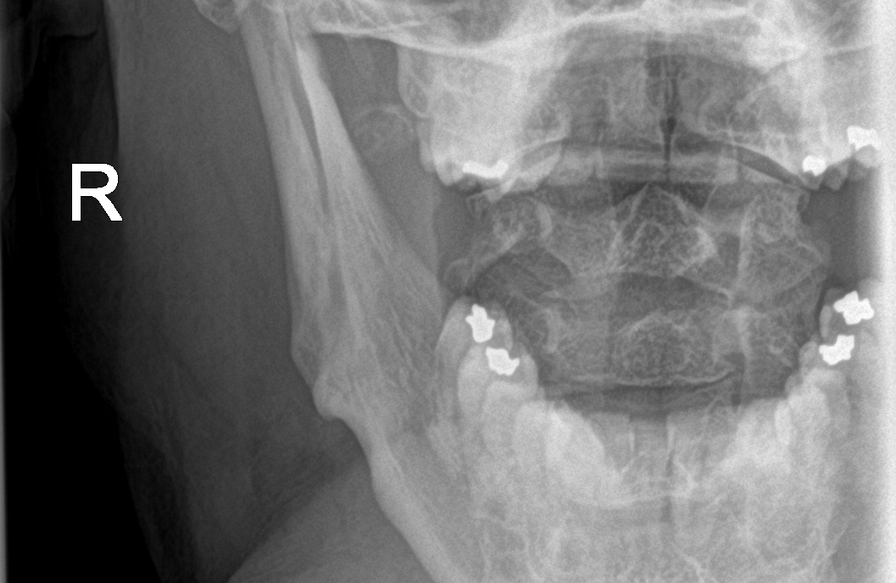
[im 6/7]
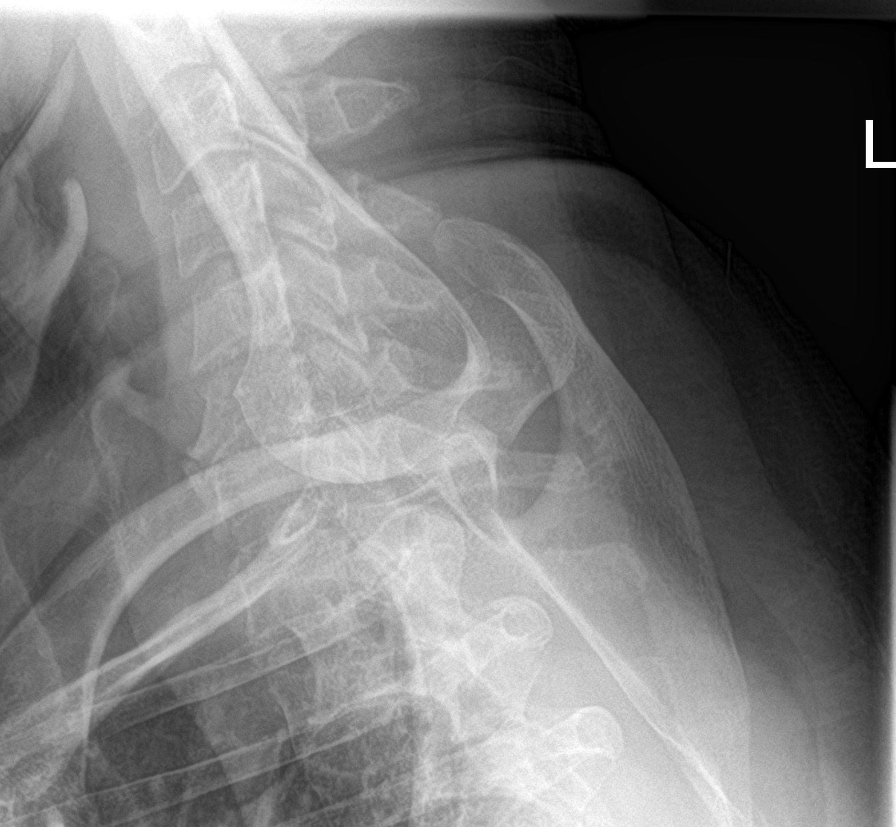
[im 7/7]
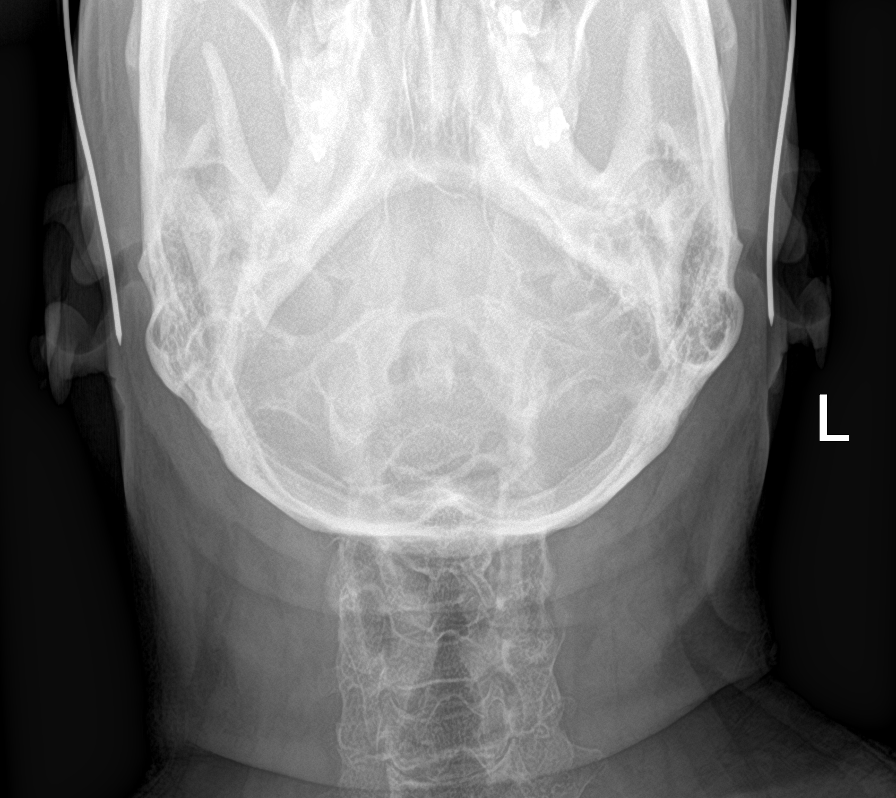

[7 of 7 positions shown; findings below may reference images not displayed]

FINDINGS: Straightening of the cervical spine. No acute fracture or listhesis.
Vertebral body height has been preserved. Intervertebral disc space
narrowing and endplate remodeling at C6-7 is in keeping with
moderate to severe degenerative disc disease at this level.
Posteriorly oriented disc osteophytes are noted at this level. No
significant canal stenosis. The remaining intervertebral disc
heights are preserved. Prevertebral soft tissues are not thickened.
Oblique images fail to profiled the right neural foramina. There is
moderate left neuroforaminal narrowing at C3-4 secondary to
uncovertebral arthrosis. Mild neuroforaminal narrowing at C6-7
secondary to uncovertebral arthrosis.
IMPRESSION: Severe degenerative disc disease C6-7.

Moderate neuroforaminal narrowing at C3-4 and mild neuroforaminal
narrowing at C6-7 on the left secondary to uncovertebral arthrosis.

## 2023-01-11 ENCOUNTER — Other Ambulatory Visit (INDEPENDENT_AMBULATORY_CARE_PROVIDER_SITE_OTHER): Payer: BC Managed Care – PPO

## 2023-01-11 DIAGNOSIS — R7989 Other specified abnormal findings of blood chemistry: Secondary | ICD-10-CM | POA: Diagnosis not present

## 2023-01-12 ENCOUNTER — Telehealth: Payer: Self-pay

## 2023-01-12 LAB — VITAMIN D 25 HYDROXY (VIT D DEFICIENCY, FRACTURES): Vit D, 25-Hydroxy: 36 ng/mL (ref 30.0–100.0)

## 2023-01-12 NOTE — Telephone Encounter (Signed)
-----   Message from Marikay Alar sent at 01/12/2023 10:11 AM EDT ----- Please let the patient know his vitamin D has improved.  He needs to continue vitamin D 1000 international units once daily.

## 2023-01-14 ENCOUNTER — Telehealth: Payer: Self-pay

## 2023-01-14 NOTE — Telephone Encounter (Signed)
Left message to call the office back regarding the message below.

## 2023-01-14 NOTE — Telephone Encounter (Signed)
-----   Message from Marikay Alar sent at 01/12/2023 10:11 AM EDT ----- Please let the patient know his vitamin D has improved.  He needs to continue vitamin D 1000 international units once daily.

## 2023-01-18 ENCOUNTER — Telehealth: Payer: Self-pay

## 2023-01-18 NOTE — Telephone Encounter (Signed)
-----   Message from Marikay Alar sent at 01/12/2023 10:11 AM EDT ----- Please let the patient know his vitamin D has improved.  He needs to continue vitamin D 1000 international units once daily.

## 2023-01-18 NOTE — Telephone Encounter (Signed)
Pt called back and I read the message to him and he understood 

## 2023-01-18 NOTE — Telephone Encounter (Signed)
Left message to call the office back regarding the result note below.

## 2023-01-18 NOTE — Telephone Encounter (Signed)
Noted  

## 2023-07-05 DIAGNOSIS — J0100 Acute maxillary sinusitis, unspecified: Secondary | ICD-10-CM | POA: Diagnosis not present

## 2023-11-04 ENCOUNTER — Other Ambulatory Visit: Payer: Self-pay | Admitting: Nurse Practitioner

## 2023-11-04 DIAGNOSIS — E785 Hyperlipidemia, unspecified: Secondary | ICD-10-CM

## 2023-11-04 MED ORDER — ROSUVASTATIN CALCIUM 40 MG PO TABS: 40.0000 mg | ORAL_TABLET | Freq: Every day | ORAL | 0 refills | Status: DC

## 2023-11-04 NOTE — Telephone Encounter (Signed)
 Copied from CRM [id number]. Topic: Clinical - Medication Refill >> Nov 04, 2023  9:09 AM Allyne Areola wrote: Medication: rosuvastatin  (CRESTOR ) 40 MG tablet   Has the patient contacted their pharmacy? No (Agent: If no, request that the patient contact the pharmacy for the refill. If patient does not wish to contact the pharmacy document the reason why and proceed with request.) (Agent: If yes, when and what did the pharmacy advise?)  This is the patient's preferred pharmacy:  [facility] [id number] [place], [place] - [address] [place] [address] Phone: [phone] Fax: [phone]  Is this the correct pharmacy for this prescription? Yes If no, delete pharmacy and type the correct one.   Has the prescription been filled recently? No  Is the patient out of the medication? Yes  Has the patient been seen for an appointment in the last year OR does the patient have an upcoming appointment? Yes  Can we respond through MyChart? Yes  Agent: Please be advised that Rx refills may take up to 3 business days. We ask that you follow-up with your pharmacy.

## 2023-12-17 ENCOUNTER — Ambulatory Visit: Admitting: Nurse Practitioner

## 2023-12-17 ENCOUNTER — Encounter: Payer: Self-pay | Admitting: Nurse Practitioner

## 2023-12-17 VITALS — BP 138/82 | HR 66 | Temp 98.2°F | Resp 20 | Ht 65.0 in | Wt 195.5 lb

## 2023-12-17 DIAGNOSIS — G4733 Obstructive sleep apnea (adult) (pediatric): Secondary | ICD-10-CM | POA: Diagnosis not present

## 2023-12-17 DIAGNOSIS — E559 Vitamin D deficiency, unspecified: Secondary | ICD-10-CM

## 2023-12-17 DIAGNOSIS — K219 Gastro-esophageal reflux disease without esophagitis: Secondary | ICD-10-CM | POA: Diagnosis not present

## 2023-12-17 DIAGNOSIS — G514 Facial myokymia: Secondary | ICD-10-CM

## 2023-12-17 DIAGNOSIS — E785 Hyperlipidemia, unspecified: Secondary | ICD-10-CM | POA: Diagnosis not present

## 2023-12-17 DIAGNOSIS — R7303 Prediabetes: Secondary | ICD-10-CM

## 2023-12-17 DIAGNOSIS — R5383 Other fatigue: Secondary | ICD-10-CM

## 2023-12-17 DIAGNOSIS — Z125 Encounter for screening for malignant neoplasm of prostate: Secondary | ICD-10-CM

## 2023-12-17 MED ORDER — ROSUVASTATIN CALCIUM 40 MG PO TABS: 40.0000 mg | ORAL_TABLET | Freq: Every day | ORAL | 3 refills | Status: AC

## 2023-12-17 NOTE — Progress Notes (Signed)
 Leron Glance, NP-C Phone: [phone]  Angel Hudson is a 58 y.o. male who presents today for transfer of care.   Discussed the use of AI scribe software for clinical note transcription with the patient, who gave verbal consent to proceed.  History of Present Illness   Angel Hudson is a 58 year old male who presents for transfer of care.   He has been experiencing muscle spasms above his lip for the past few weeks, described as a twitching sensation on the side of his nose and above his lip. He suspects stress may be a contributing factor due to recent increased stress levels in his life.  He takes Crestor  daily and requires a refill. He experiences occasional acid reflux, which he manages with Tums as needed, and does not follow a specific diet for it. He enjoys foods that may exacerbate reflux, such as caffeine, alcohol, spicy, greasy, and acidic foods. No abdominal pain or muscle pain in his legs.  He has a history of prediabetes. 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 He wakes up at 3:30 AM for work and goes to bed by 8 PM, reporting good sleep quality despite snoring. He feels well-rested upon waking.  He grew up eating vegetables and enjoys them, but his family does not share the same dietary habits. He eats out once or twice a week and sometimes overeats at night due to not eating much during the day. He is considering eating smaller, more frequent meals throughout the day.      Social History   Tobacco Use  Smoking Status Former  Smokeless Tobacco Never    No current outpatient medications on file prior to visit.   No current facility-administered medications on file prior to visit.     ROS see history of present illness  Objective  Physical Exam Vitals:   12/17/23 0945  BP: 138/82  Pulse: 66  Resp: 20  Temp: 98.2 F (36.8 C)  SpO2: 99%    BP Readings from Last 3 Encounters:  12/17/23  138/82  11/04/22 126/82  04/10/22 120/78   Wt Readings from Last 3 Encounters:  12/17/23 195 lb 8 oz (88.7 kg)  11/04/22 200 lb 3.2 oz (90.8 kg)  04/10/22 210 lb 3.2 oz (95.3 kg)    Physical Exam Constitutional:      General: He is not in acute distress.    Appearance: Normal appearance.  HENT:     Head: Normocephalic.  Cardiovascular:     Rate and Rhythm: Normal rate and regular rhythm.     Heart sounds: Normal heart sounds.  Pulmonary:     Effort: Pulmonary effort is normal.     Breath sounds: Normal breath sounds.  Skin:    General: Skin is warm and dry.  Neurological:     General: No focal deficit present.     Mental Status: He is alert.  Psychiatric:        Mood and Affect: Mood normal.        Behavior: Behavior normal.      Assessment/Plan: Please see individual problem list.  Facial twitching Assessment & Plan: Intermittent spasms could be due to stress. We will check lab work as outlined for further evaluation.   Orders: -     TSH -     Vitamin B12 -     Iron, TIBC and Ferritin Panel  Hyperlipidemia, unspecified hyperlipidemia type Assessment & Plan: Managed  with Crestor . Continue Crestor  40 mg daily. Check lipid panel.  Orders: -     Rosuvastatin  Calcium ; Take 1 tablet (40 mg total) by mouth daily.  Dispense: 90 tablet; Refill: 3 -     Lipid panel -     Comprehensive metabolic panel with GFR  OSA (obstructive sleep apnea) Assessment & Plan: He is non-compliant with CPAP. Discussed the benefits of CPAP use despite feeling well-rested. Counseled on health risks of untreated sleep apnea.   Orders: -     CBC with Differential/Platelet  Gastroesophageal reflux disease, unspecified whether esophagitis present Assessment & Plan: Intermittent symptoms are managed with Tums. Dietary habits may contribute. Consider dietary modifications.    Prediabetes Assessment & Plan: Emphasized the importance of monitoring diet and A1c to prevent diabetes.  Encourage dietary modifications and regular physical activity. Check A1c today.   Orders: -     Hemoglobin A1c  Vitamin D  deficiency -     VITAMIN D  25 Hydroxy (Vit-D Deficiency, Fractures)  Other fatigue -     Testosterone   Screening PSA (prostate specific antigen) -     PSA    Return in about 1 year (around 12/16/2024) for Annual Exam, sooner as needed.   Leron Glance, NP-C [facility]

## 2023-12-18 LAB — COMPREHENSIVE METABOLIC PANEL WITH GFR
ALT: 25 IU/L (ref 0–44)
AST: 19 IU/L (ref 0–40)
Albumin: 4.5 g/dL (ref 3.8–4.9)
Alkaline Phosphatase: 68 IU/L (ref 44–121)
BUN/Creatinine Ratio: 13 (ref 9–20)
BUN: 11 mg/dL (ref 6–24)
Bilirubin Total: 0.4 mg/dL (ref 0.0–1.2)
CO2: 24 mmol/L (ref 20–29)
Calcium: 9.8 mg/dL (ref 8.7–10.2)
Chloride: 103 mmol/L (ref 96–106)
Creatinine, Ser: 0.83 mg/dL (ref 0.76–1.27)
Globulin, Total: 2.4 g/dL (ref 1.5–4.5)
Glucose: 86 mg/dL (ref 70–99)
Potassium: 4.4 mmol/L (ref 3.5–5.2)
Sodium: 142 mmol/L (ref 134–144)
Total Protein: 6.9 g/dL (ref 6.0–8.5)
eGFR: 103 mL/min/{1.73_m2} (ref >59)

## 2023-12-18 LAB — CBC WITH DIFFERENTIAL/PLATELET
Basophils Absolute: 0.1 10*3/uL (ref 0.0–0.2)
Basos: 1 %
EOS (ABSOLUTE): 0.2 10*3/uL (ref 0.0–0.4)
Eos: 4 %
Hematocrit: 48.2 % (ref 37.5–51.0)
Hemoglobin: 16 g/dL (ref 13.0–17.7)
Immature Grans (Abs): 0 10*3/uL (ref 0.0–0.1)
Immature Granulocytes: 0 %
Lymphocytes Absolute: 2.7 10*3/uL (ref 0.7–3.1)
Lymphs: 43 %
MCH: 31.4 pg (ref 26.6–33.0)
MCHC: 33.2 g/dL (ref 31.5–35.7)
MCV: 95 fL (ref 79–97)
Monocytes Absolute: 0.5 10*3/uL (ref 0.1–0.9)
Monocytes: 8 %
Neutrophils Absolute: 2.8 10*3/uL (ref 1.4–7.0)
Neutrophils: 44 %
Platelets: 246 10*3/uL (ref 150–450)
RBC: 5.1 x10E6/uL (ref 4.14–5.80)
RDW: 13 % (ref 11.6–15.4)
WBC: 6.3 10*3/uL (ref 3.4–10.8)

## 2023-12-18 LAB — IRON,TIBC AND FERRITIN PANEL
Ferritin: 299 ng/mL (ref 30–400)
Iron Saturation: 40 % (ref 15–55)
Iron: 149 ug/dL (ref 38–169)
Total Iron Binding Capacity: 369 ug/dL (ref 250–450)
UIBC: 220 ug/dL (ref 111–343)

## 2023-12-18 LAB — LIPID PANEL
Chol/HDL Ratio: 4.8 ratio (ref 0.0–5.0)
Cholesterol, Total: 171 mg/dL (ref 100–199)
HDL: 36 mg/dL — ABNORMAL LOW (ref >39)
LDL Chol Calc (NIH): 114 mg/dL — ABNORMAL HIGH (ref 0–99)
Triglycerides: 118 mg/dL (ref 0–149)
VLDL Cholesterol Cal: 21 mg/dL (ref 5–40)

## 2023-12-18 LAB — VITAMIN D 25 HYDROXY (VIT D DEFICIENCY, FRACTURES): Vit D, 25-Hydroxy: 40.9 ng/mL (ref 30.0–100.0)

## 2023-12-18 LAB — TESTOSTERONE: Testosterone: 386 ng/dL (ref 264–916)

## 2023-12-18 LAB — HEMOGLOBIN A1C
Est. average glucose Bld gHb Est-mCnc: 120 mg/dL
Hgb A1c MFr Bld: 5.8 % — ABNORMAL HIGH (ref 4.8–5.6)

## 2023-12-18 LAB — PSA: Prostate Specific Ag, Serum: 1.6 ng/mL (ref 0.0–4.0)

## 2023-12-18 LAB — TSH: TSH: 1.02 u[IU]/mL (ref 0.450–4.500)

## 2023-12-18 LAB — VITAMIN B12: Vitamin B-12: 436 pg/mL (ref 232–1245)

## 2023-12-20 ENCOUNTER — Ambulatory Visit: Payer: Self-pay | Admitting: Nurse Practitioner

## 2024-01-03 ENCOUNTER — Encounter: Payer: Self-pay | Admitting: Nurse Practitioner

## 2024-01-03 DIAGNOSIS — G514 Facial myokymia: Secondary | ICD-10-CM | POA: Insufficient documentation

## 2024-01-03 NOTE — Assessment & Plan Note (Signed)
 Managed with Crestor . Continue Crestor  40 mg daily. Check lipid panel.

## 2024-01-03 NOTE — Assessment & Plan Note (Addendum)
 Emphasized the importance of monitoring diet and A1c to prevent diabetes. Encourage dietary modifications and regular physical activity. Check A1c today.

## 2024-01-03 NOTE — Assessment & Plan Note (Signed)
 Intermittent symptoms are managed with Tums. Dietary habits may contribute. Consider dietary modifications.

## 2024-01-03 NOTE — Assessment & Plan Note (Signed)
 Intermittent spasms could be due to stress. We will check lab work as outlined for further evaluation.

## 2024-01-03 NOTE — Assessment & Plan Note (Signed)
 He is non-compliant with CPAP. Discussed the benefits of CPAP use despite feeling well-rested. Counseled on health risks of untreated sleep apnea.

## 2024-12-22 ENCOUNTER — Encounter: Admitting: Nurse Practitioner
